# Patient Record
Sex: Female | Born: 1975 | Race: White | Hispanic: No | Marital: Married | Smoking: Former smoker
Health system: Southern US, Community
[De-identification: ages and names within clinical notes are randomized; demographics above are authoritative.]

## PROBLEM LIST (undated history)

## (undated) DIAGNOSIS — F329 Major depressive disorder, single episode, unspecified: Secondary | ICD-10-CM

## (undated) DIAGNOSIS — R519 Headache, unspecified: Secondary | ICD-10-CM

## (undated) DIAGNOSIS — F32A Depression, unspecified: Secondary | ICD-10-CM

## (undated) DIAGNOSIS — M797 Fibromyalgia: Secondary | ICD-10-CM

## (undated) DIAGNOSIS — D759 Disease of blood and blood-forming organs, unspecified: Secondary | ICD-10-CM

## (undated) DIAGNOSIS — R51 Headache: Secondary | ICD-10-CM

## (undated) DIAGNOSIS — I10 Essential (primary) hypertension: Secondary | ICD-10-CM

## (undated) DIAGNOSIS — E119 Type 2 diabetes mellitus without complications: Secondary | ICD-10-CM

## (undated) HISTORY — PX: APPENDECTOMY: SHX54

---

## 1997-01-20 HISTORY — PX: TUBAL LIGATION: SHX77

## 1998-01-20 HISTORY — PX: BONY PELVIS SURGERY: SHX572

## 2000-01-21 HISTORY — PX: ABDOMINAL HYSTERECTOMY: SHX81

## 2008-01-21 HISTORY — PX: CHOLECYSTECTOMY: SHX55

## 2012-10-21 DIAGNOSIS — L03211 Cellulitis of face: Secondary | ICD-10-CM | POA: Insufficient documentation

## 2013-01-31 DIAGNOSIS — N83202 Unspecified ovarian cyst, left side: Secondary | ICD-10-CM | POA: Insufficient documentation

## 2013-01-31 DIAGNOSIS — R102 Pelvic and perineal pain: Secondary | ICD-10-CM | POA: Insufficient documentation

## 2013-01-31 DIAGNOSIS — N809 Endometriosis, unspecified: Secondary | ICD-10-CM | POA: Insufficient documentation

## 2013-11-08 DIAGNOSIS — G43709 Chronic migraine without aura, not intractable, without status migrainosus: Secondary | ICD-10-CM | POA: Insufficient documentation

## 2014-03-23 DIAGNOSIS — E118 Type 2 diabetes mellitus with unspecified complications: Secondary | ICD-10-CM | POA: Insufficient documentation

## 2014-06-21 DIAGNOSIS — M791 Myalgia, unspecified site: Secondary | ICD-10-CM | POA: Insufficient documentation

## 2015-01-03 DIAGNOSIS — F329 Major depressive disorder, single episode, unspecified: Secondary | ICD-10-CM | POA: Insufficient documentation

## 2015-06-08 DIAGNOSIS — E669 Obesity, unspecified: Secondary | ICD-10-CM | POA: Insufficient documentation

## 2015-10-09 DIAGNOSIS — M797 Fibromyalgia: Secondary | ICD-10-CM | POA: Insufficient documentation

## 2015-10-09 DIAGNOSIS — M545 Low back pain, unspecified: Secondary | ICD-10-CM | POA: Insufficient documentation

## 2015-10-09 DIAGNOSIS — E118 Type 2 diabetes mellitus with unspecified complications: Secondary | ICD-10-CM | POA: Insufficient documentation

## 2015-10-18 DIAGNOSIS — E782 Mixed hyperlipidemia: Secondary | ICD-10-CM | POA: Insufficient documentation

## 2015-12-24 DIAGNOSIS — F17210 Nicotine dependence, cigarettes, uncomplicated: Secondary | ICD-10-CM | POA: Insufficient documentation

## 2016-01-22 DIAGNOSIS — M5116 Intervertebral disc disorders with radiculopathy, lumbar region: Secondary | ICD-10-CM | POA: Insufficient documentation

## 2016-01-22 DIAGNOSIS — F419 Anxiety disorder, unspecified: Secondary | ICD-10-CM | POA: Insufficient documentation

## 2016-01-22 DIAGNOSIS — G4700 Insomnia, unspecified: Secondary | ICD-10-CM | POA: Insufficient documentation

## 2016-05-05 ENCOUNTER — Other Ambulatory Visit: Payer: Self-pay | Admitting: Orthopedic Surgery

## 2016-05-05 DIAGNOSIS — M545 Low back pain, unspecified: Secondary | ICD-10-CM

## 2016-05-07 ENCOUNTER — Ambulatory Visit
Admission: RE | Admit: 2016-05-07 | Discharge: 2016-05-07 | Disposition: A | Discharge status: Home / Self Care | Payer: 59 | Source: Ambulatory Visit | Attending: Orthopedic Surgery | Admitting: Orthopedic Surgery

## 2016-05-07 ENCOUNTER — Encounter: Payer: Self-pay | Admitting: Radiology

## 2016-05-07 ENCOUNTER — Other Ambulatory Visit: Payer: Self-pay | Admitting: Orthopedic Surgery

## 2016-05-07 VITALS — BP 159/71 | HR 98 | Resp 20 | Ht 67.0 in | Wt 177.0 lb

## 2016-05-07 DIAGNOSIS — M545 Low back pain, unspecified: Secondary | ICD-10-CM

## 2016-05-07 DIAGNOSIS — M5116 Intervertebral disc disorders with radiculopathy, lumbar region: Secondary | ICD-10-CM

## 2016-05-07 MED ORDER — MIDAZOLAM HCL 2 MG/2ML IJ SOLN
1.0000 mg | INTRAMUSCULAR | Status: DC | PRN
  Administered 2016-05-07: 0.5 mg via INTRAVENOUS
  Administered 2016-05-07 (×2): 1 mg via INTRAVENOUS
  Administered 2016-05-07: 0.5 mg via INTRAVENOUS

## 2016-05-07 MED ORDER — VANCOMYCIN HCL IN DEXTROSE 1-5 GM/200ML-% IV SOLN
1000.0000 mg | Freq: Once | INTRAVENOUS | Status: AC
  Administered 2016-05-07: 1000 mg via INTRAVENOUS

## 2016-05-07 MED ORDER — SODIUM CHLORIDE 0.9 % IV SOLN
Freq: Once | INTRAVENOUS | Status: AC
  Administered 2016-05-07: 08:00:00 via INTRAVENOUS

## 2016-05-07 MED ORDER — HYDROMORPHONE HCL 2 MG/ML IJ SOLN
2.0000 mg | Freq: Once | INTRAMUSCULAR | Status: AC
  Administered 2016-05-07: 2 mg via INTRAMUSCULAR

## 2016-05-07 MED ORDER — CEFAZOLIN IN D5W 1 GM/50ML IV SOLN: 1.0000 g | Freq: Once | INTRAVENOUS | Status: DC

## 2016-05-07 MED ORDER — FENTANYL CITRATE (PF) 100 MCG/2ML IJ SOLN
25.0000 ug | INTRAMUSCULAR | Status: DC | PRN
  Administered 2016-05-07 (×2): 50 ug via INTRAVENOUS

## 2016-05-07 MED ORDER — ONDANSETRON HCL 4 MG/2ML IJ SOLN
4.0000 mg | Freq: Once | INTRAMUSCULAR | Status: AC
  Administered 2016-05-07: 4 mg via INTRAVENOUS

## 2016-05-07 MED ORDER — ONDANSETRON HCL 4 MG/2ML IJ SOLN: 4.0000 mg | Freq: Once | INTRAMUSCULAR | Status: DC

## 2016-05-07 MED ORDER — KETOROLAC TROMETHAMINE 30 MG/ML IJ SOLN
30.0000 mg | Freq: Once | INTRAMUSCULAR | Status: AC
  Administered 2016-05-07: 30 mg via INTRAVENOUS

## 2016-05-07 MED ORDER — IOPAMIDOL (ISOVUE-M 200) INJECTION 41%
8.0000 mL | Freq: Once | INTRAMUSCULAR | Status: AC
  Administered 2016-05-07: 8 mL

## 2016-05-07 NOTE — Discharge Instructions (Signed)
Discogram Post Procedure Discharge Instructions ° °1. May resume a regular diet and any medications that you routinely take (including pain medications). °2. No driving day of procedure. °3. Upon discharge go home and rest for at least 4 hours.  May use an ice pack as needed to injection sites on back.  Ice to back 30 minutes on and 30 minutes off, all day. °4. May remove bandades later, today. °5. It is not unusual to be sore for several days after this procedure. ° ° ° °Please contact our office at [phone] for the following symptoms: ° °· Fever greater than 100 degrees °· Increased swelling, pain, or redness at injection site. ° ° °Thank you for visiting [facility]. ° ° °

## 2016-05-07 NOTE — Progress Notes (Signed)
28 minutes sedation time for discogram.  Donell Sievert, RN

## 2016-05-20 ENCOUNTER — Ambulatory Visit: Payer: Self-pay | Admitting: Physician Assistant

## 2016-06-02 NOTE — Pre-Procedure Instructions (Signed)
Debra Padilla  06/02/2016      [facility] [place] [address] [place] [address] Phone: [phone] Fax: [phone]    Your procedure is scheduled on May 24  [facility] at 530 A.M.  Call this number if you have problems the morning of surgery:  [phone]   Remember:  Do not eat food or drink liquids after midnight.  Take these medicines the morning of surgery with A SIP OF WATER Dimenhydrinate (Dramamine), hydrocodone (Norco) if needed, Loratadine (Claritin), Sertraline (Zoloft)  Stop/ take aspirin as directed by your Dr.   Stop taking Ibuprofen, Advil, Motrin, BC's, goody's, Herbal medications, Vitamins, Fish Oil    How to Manage Your Diabetes Before and After Surgery  Why is it important to control my blood sugar before and after surgery? . Improving blood sugar levels before and after surgery helps healing and can limit problems. . A way of improving blood sugar control is eating a healthy diet by: o  Eating less sugar and carbohydrates o  Increasing activity/exercise o  Talking with your doctor about reaching your blood sugar goals . High blood sugars (greater than 180 mg/dL) can raise your risk of infections and slow your recovery, so you will need to focus on controlling your diabetes during the weeks before surgery. . Make sure that the doctor who takes care of your diabetes knows about your planned surgery including the date and location.  How do I manage my blood sugar before surgery? . Check your blood sugar at least 4 times a day, starting 2 days before surgery, to make sure that the level is not too high or low. o Check your blood sugar the morning of your surgery when you wake up and every 2 hours until you get to the Short Stay unit. . If your blood sugar is less than 70 mg/dL, you will need to treat for low blood sugar: o Do not take insulin. o Treat  a low blood sugar (less than 70 mg/dL) with  cup of clear juice (cranberry or apple), 4 glucose tablets, OR glucose gel. o Recheck blood sugar in 15 minutes after treatment (to make sure it is greater than 70 mg/dL). If your blood sugar is not greater than 70 mg/dL on recheck, call [phone] for further instructions. . Report your blood sugar to the short stay nurse when you get to Short Stay.  . If you are admitted to the hospital after surgery: o Your blood sugar will be checked by the staff and you will probably be given insulin after surgery (instead of oral diabetes medicines) to make sure you have good blood sugar levels. o The goal for blood sugar control after surgery is 80-180 mg/dL.              WHAT DO I DO ABOUT MY DIABETES MEDICATION?   Do not take oral diabetes medicines (pills) the morning of surgery. Metformin (Glucophage)       . The day of surgery, do not take other diabetes injectables, including Byetta (exenatide), Bydureon (exenatide ER), Victoza (liraglutide), or Trulicity (dulaglutide).  . If your CBG is greater than 220 mg/dL, you may take  of your sliding scale (correction) dose of insulin.  Other Instructions:          Patient Signature:  Date:   Nurse Signature:  Date:   Reviewed and Endorsed by [facility] Patient Education Committee, August  2015  Do not wear jewelry, make-up or nail polish.  Do not wear lotions, powders, or perfumes, or deoderant.  Do not shave 48 hours prior to surgery.  Men may shave face and neck.  Do not bring valuables to the hospital.  [facility] is not responsible for any belongings or valuables.  Contacts, dentures or bridgework may not be worn into surgery.  Leave your suitcase in the car.  After surgery it may be brought to your room.  For patients admitted to the hospital, discharge time will be determined by your treatment team.  Patients discharged the day of surgery will not be allowed to drive home.     Special instructions:  [place] - Preparing for Surgery  Before surgery, you can play an important role.  Because skin is not sterile, your skin needs to be as free of germs as possible.  You can reduce the number of germs on you skin by washing with CHG (chlorahexidine gluconate) soap before surgery.  CHG is an antiseptic cleaner which kills germs and bonds with the skin to continue killing germs even after washing.  Please DO NOT use if you have an allergy to CHG or antibacterial soaps.  If your skin becomes reddened/irritated stop using the CHG and inform your nurse when you arrive at Short Stay.  Do not shave (including legs and underarms) for at least 48 hours prior to the first CHG shower.  You may shave your face.  Please follow these instructions carefully:   1.  Shower with CHG Soap the night before surgery and the  morning of Surgery.  2.  If you choose to wash your hair, wash your hair first as usual with your normal shampoo.  3.  After you shampoo, rinse your hair and body thoroughly to remove the  Shampoo.  4.  Use CHG as you would any other liquid soap.  You can apply chg directly  to the skin and wash gently with scrungie or a clean washcloth.  5.  Apply the CHG Soap to your body ONLY FROM THE NECK DOWN.   Do not use on open wounds or open sores.  Avoid contact with your eyes,       ears, mouth and genitals (private parts).  Wash genitals (private parts)  with your normal soap.  6.  Wash thoroughly, paying special attention to the area where your surgery will be performed.  7.  Thoroughly rinse your body with warm water from the neck down.  8.  DO NOT shower/wash with your normal soap after using and rinsing off  the CHG Soap.  9.  Pat yourself dry with a clean towel.            10.  Wear clean pajamas.            11.  Place clean sheets on your bed the night of your first shower and do not   sleep with pets.  Day of Surgery  Do not apply any lotions/deoderants the  morning of surgery.  Please wear clean clothes to the hospital/surgery center.     Please read over the following fact sheets that you were given. Pain Booklet, Coughing and Deep Breathing, MRSA Information and Surgical Site Infection Prevention

## 2016-06-03 ENCOUNTER — Encounter (HOSPITAL_COMMUNITY): Payer: Self-pay

## 2016-06-03 ENCOUNTER — Encounter (HOSPITAL_COMMUNITY)
Admission: RE | Admit: 2016-06-03 | Discharge: 2016-06-03 | Disposition: A | Discharge status: Home / Self Care | Payer: 59 | Source: Ambulatory Visit | Attending: Orthopedic Surgery | Admitting: Orthopedic Surgery

## 2016-06-03 DIAGNOSIS — E119 Type 2 diabetes mellitus without complications: Secondary | ICD-10-CM | POA: Insufficient documentation

## 2016-06-03 DIAGNOSIS — Z01812 Encounter for preprocedural laboratory examination: Secondary | ICD-10-CM | POA: Insufficient documentation

## 2016-06-03 DIAGNOSIS — M5136 Other intervertebral disc degeneration, lumbar region: Secondary | ICD-10-CM | POA: Insufficient documentation

## 2016-06-03 DIAGNOSIS — D759 Disease of blood and blood-forming organs, unspecified: Secondary | ICD-10-CM | POA: Insufficient documentation

## 2016-06-03 DIAGNOSIS — M5137 Other intervertebral disc degeneration, lumbosacral region: Secondary | ICD-10-CM | POA: Insufficient documentation

## 2016-06-03 DIAGNOSIS — F329 Major depressive disorder, single episode, unspecified: Secondary | ICD-10-CM | POA: Diagnosis not present

## 2016-06-03 DIAGNOSIS — R51 Headache: Secondary | ICD-10-CM | POA: Insufficient documentation

## 2016-06-03 DIAGNOSIS — M797 Fibromyalgia: Secondary | ICD-10-CM | POA: Diagnosis not present

## 2016-06-03 DIAGNOSIS — I10 Essential (primary) hypertension: Secondary | ICD-10-CM | POA: Diagnosis not present

## 2016-06-03 HISTORY — DX: Disease of blood and blood-forming organs, unspecified: D75.9

## 2016-06-03 HISTORY — DX: Fibromyalgia: M79.7

## 2016-06-03 HISTORY — DX: Headache, unspecified: R51.9

## 2016-06-03 HISTORY — DX: Type 2 diabetes mellitus without complications: E11.9

## 2016-06-03 HISTORY — DX: Major depressive disorder, single episode, unspecified: F32.9

## 2016-06-03 HISTORY — DX: Essential (primary) hypertension: I10

## 2016-06-03 HISTORY — DX: Headache: R51

## 2016-06-03 HISTORY — DX: Depression, unspecified: F32.A

## 2016-06-03 LAB — SURGICAL PCR SCREEN
MRSA, PCR: NEGATIVE
STAPHYLOCOCCUS AUREUS: NEGATIVE

## 2016-06-03 LAB — BASIC METABOLIC PANEL
ANION GAP: 10 (ref 5–15)
BUN: 5 mg/dL — ABNORMAL LOW (ref 6–20)
CALCIUM: 9.2 mg/dL (ref 8.9–10.3)
CO2: 23 mmol/L (ref 22–32)
Chloride: 104 mmol/L (ref 101–111)
Creatinine, Ser: 0.58 mg/dL (ref 0.44–1.00)
GLUCOSE: 90 mg/dL (ref 65–99)
POTASSIUM: 3.7 mmol/L (ref 3.5–5.1)
Sodium: 137 mmol/L (ref 135–145)

## 2016-06-03 LAB — CBC
HEMATOCRIT: 44.1 % (ref 36.0–46.0)
Hemoglobin: 15 g/dL (ref 12.0–15.0)
MCH: 32.8 pg (ref 26.0–34.0)
MCHC: 34 g/dL (ref 30.0–36.0)
MCV: 96.5 fL (ref 78.0–100.0)
PLATELETS: 219 10*3/uL (ref 150–400)
RBC: 4.57 MIL/uL (ref 3.87–5.11)
RDW: 12.8 % (ref 11.5–15.5)
WBC: 12 10*3/uL — ABNORMAL HIGH (ref 4.0–10.5)

## 2016-06-03 LAB — TYPE AND SCREEN
ABO/RH(D): O POS
Antibody Screen: NEGATIVE

## 2016-06-03 LAB — ABO/RH: ABO/RH(D): O POS

## 2016-06-03 LAB — GLUCOSE, CAPILLARY: Glucose-Capillary: 113 mg/dL — ABNORMAL HIGH (ref 65–99)

## 2016-06-03 NOTE — Pre-Procedure Instructions (Addendum)
Dian Carte  06/03/2016      [facility] [place] [address] [place] [address] Phone: [phone] Fax: [phone]    Your procedure is scheduled on May 24  Report to [facility] Admitting at 530 A.M.  Call this number if you have problems the morning of surgery:  [phone]   Remember:  Do not eat food or drink liquids after midnight.  Take these medicines the morning of surgery with A SIP OF WATER ), hydrocodone (Norco) if needed, Sertraline (Zoloft)  Stop taking Ibuprofen, Advil, Motrin, BC's, goody's, Herbal medications, Vitamins, Fish Oil,aspirin,probiotic  No metformin day of surgery    How to Manage Your Diabetes Before and After Surgery  Why is it important to control my blood sugar before and after surgery? . Improving blood sugar levels before and after surgery helps healing and can limit problems. . A way of improving blood sugar control is eating a healthy diet by: o  Eating less sugar and carbohydrates o  Increasing activity/exercise o  Talking with your doctor about reaching your blood sugar goals . High blood sugars (greater than 180 mg/dL) can raise your risk of infections and slow your recovery, so you will need to focus on controlling your diabetes during the weeks before surgery. . Make sure that the doctor who takes care of your diabetes knows about your planned surgery including the date and location.  How do I manage my blood sugar before surgery? . Check your blood sugar at least 4 times a day, starting 2 days before surgery, to make sure that the level is not too high or low. o Check your blood sugar the morning of your surgery when you wake up and every 2 hours until you get to the Short Stay unit. . If your blood sugar is less than 70 mg/dL, you will need to treat for low blood sugar: o Do not take insulin. o Treat a low blood sugar (less than 70 mg/dL) with   cup of clear juice (cranberry or apple), 4 glucose tablets, OR glucose gel. o Recheck blood sugar in 15 minutes after treatment (to make sure it is greater than 70 mg/dL). If your blood sugar is not greater than 70 mg/dL on recheck, call [phone] for further instructions. . Report your blood sugar to the short stay nurse when you get to Short Stay.  . If you are admitted to the hospital after surgery: o Your blood sugar will be checked by the staff and you will probably be given insulin after surgery (instead of oral diabetes medicines) to make sure you have good blood sugar levels. o The goal for blood sugar control after surgery is 80-180 mg/dL.  WHAT DO I DO ABOUT MY DIABETES MEDICATION?   Do not take oral diabetes medicines (pills) the morning of surgery. Metformin (Glucophage)        Do not wear jewelry, make-up or nail polish.  Do not wear lotions, powders, or perfumes, or deoderant.  Do not shave 48 hours prior to surgery.  Men may shave face and neck.  Do not bring valuables to the hospital.  [facility] is not responsible for any belongings or valuables.  Contacts, dentures or bridgework may not be worn into surgery.  Leave your suitcase in the car.  After surgery it may be brought to your room.  For patients admitted to the hospital, discharge time will be determined by your treatment  team.  Patients discharged the day of surgery will not be allowed to drive home.   Special instructions:  [place] - Preparing for Surgery  Before surgery, you can play an important role.  Because skin is not sterile, your skin needs to be as free of germs as possible.  You can reduce the number of germs on you skin by washing with CHG (chlorahexidine gluconate) soap before surgery.  CHG is an antiseptic cleaner which kills germs and bonds with the skin to continue killing germs even after washing.  Please DO NOT use if you have an allergy to CHG or antibacterial soaps.  If your skin  becomes reddened/irritated stop using the CHG and inform your nurse when you arrive at Short Stay.  Do not shave (including legs and underarms) for at least 48 hours prior to the first CHG shower.  You may shave your face.  Please follow these instructions carefully:   1.  Shower with CHG Soap the night before surgery and the  morning of Surgery.  2.  If you choose to wash your hair, wash your hair first as usual with your normal shampoo.  3.  After you shampoo, rinse your hair and body thoroughly to remove the  Shampoo.  4.  Use CHG as you would any other liquid soap.  You can apply chg directly  to the skin and wash gently with scrungie or a clean washcloth.  5.  Apply the CHG Soap to your body ONLY FROM THE NECK DOWN.   Do not use on open wounds or open sores.  Avoid contact with your eyes,       ears, mouth and genitals (private parts).  Wash genitals (private parts)  with your normal soap.  6.  Wash thoroughly, paying special attention to the area where your surgery will be performed.  7.  Thoroughly rinse your body with warm water from the neck down.  8.  DO NOT shower/wash with your normal soap after using and rinsing off  the CHG Soap.  9.  Pat yourself dry with a clean towel.            10.  Wear clean pajamas.            11.  Place clean sheets on your bed the night of your first shower and do not   sleep with pets.  Day of Surgery  Do not apply any lotions/deoderants the morning of surgery.  Please wear clean clothes to the hospital/surgery center.     Please read over the  fact sheets that you were given.

## 2016-06-12 ENCOUNTER — Encounter (HOSPITAL_COMMUNITY): Admission: RE | Payer: Self-pay | Source: Ambulatory Visit

## 2016-06-12 ENCOUNTER — Inpatient Hospital Stay (HOSPITAL_COMMUNITY): Admission: RE | Admit: 2016-06-12 | Payer: 59 | Source: Ambulatory Visit | Admitting: Orthopedic Surgery

## 2016-06-12 SURGERY — TRANSFORAMINAL LUMBAR INTERBODY FUSION (TLIF) WITH PEDICLE SCREW FIXATION 2 LEVEL
Anesthesia: General

## 2016-07-21 ENCOUNTER — Ambulatory Visit: Payer: Self-pay | Admitting: Physician Assistant

## 2016-08-04 ENCOUNTER — Encounter (HOSPITAL_COMMUNITY)
Admission: RE | Admit: 2016-08-04 | Discharge: 2016-08-04 | Disposition: A | Discharge status: Home / Self Care | Payer: 59 | Source: Ambulatory Visit | Attending: Orthopedic Surgery | Admitting: Orthopedic Surgery

## 2016-08-04 ENCOUNTER — Encounter (HOSPITAL_COMMUNITY): Payer: Self-pay

## 2016-08-04 LAB — CBC
HCT: 43.3 % (ref 36.0–46.0)
Hemoglobin: 14.8 g/dL (ref 12.0–15.0)
MCH: 33.7 pg (ref 26.0–34.0)
MCHC: 34.2 g/dL (ref 30.0–36.0)
MCV: 98.6 fL (ref 78.0–100.0)
PLATELETS: 231 10*3/uL (ref 150–400)
RBC: 4.39 MIL/uL (ref 3.87–5.11)
RDW: 12.9 % (ref 11.5–15.5)
WBC: 12.6 10*3/uL — ABNORMAL HIGH (ref 4.0–10.5)

## 2016-08-04 LAB — SURGICAL PCR SCREEN
MRSA, PCR: NEGATIVE
Staphylococcus aureus: NEGATIVE

## 2016-08-04 LAB — BASIC METABOLIC PANEL
Anion gap: 9 (ref 5–15)
BUN: 6 mg/dL (ref 6–20)
CO2: 26 mmol/L (ref 22–32)
CREATININE: 0.8 mg/dL (ref 0.44–1.00)
Calcium: 9.5 mg/dL (ref 8.9–10.3)
Chloride: 101 mmol/L (ref 101–111)
GFR calc Af Amer: >60 mL/min (ref >60)
GLUCOSE: 189 mg/dL — AB (ref 65–99)
POTASSIUM: 4.3 mmol/L (ref 3.5–5.1)
SODIUM: 136 mmol/L (ref 135–145)

## 2016-08-04 LAB — TYPE AND SCREEN
ABO/RH(D): O POS
Antibody Screen: NEGATIVE

## 2016-08-04 LAB — GLUCOSE, CAPILLARY: GLUCOSE-CAPILLARY: 223 mg/dL — AB (ref 65–99)

## 2016-08-04 NOTE — Pre-Procedure Instructions (Signed)
    Tawnia Fera  08/04/2016      [facility] [place] [address] [place] [address] Phone: [phone] Fax: [phone]    Your procedure is scheduled on Thursday, July 19th   [facility] at 5:30 AM             (posted surgery time 7:30am-11:27am) .  Call this number if you have problems the morning of surgery:  [phone], other questions call [phone] Mon-Fri from 8a-4p   Remember:  Do not eat food or drink liquids after midnight Wednesday.                        4-5 days prior to surgery, STOP TAKING any Vitamins, Herbal Supplements, Anti-inflammatories.   Take these medicines the morning of surgery with A SIP OF WATER : Hydrocodone.              DO NOT TAKE ANY DIABETES MEDICATION THE MORNING OF SURGERY.   Do not wear jewelry, make-up or nail polish.  Do not wear lotions, powders, perfumes, or deoderant.  Do not shave 48 hours prior to surgery.    Do not bring valuables to the hospital.  [facility] is not responsible for any belongings or valuables.  Contacts, dentures or bridgework may not be worn into surgery.  Leave your suitcase in the car.  After surgery it may be brought to your room.  For patients admitted to the hospital, discharge time will be determined by your treatment team.  Please read over the following fact sheets that you were given. Pain Booklet, MRSA Information and Surgical Site Infection Prevention

## 2016-08-04 NOTE — Progress Notes (Signed)
PCP is Dr. Vear ClockPhillips  @ [facility]   A1C 7.0  05/2016.   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  Hematologist is Dr[facility] Mariel [facility]Hareesh [facility] .  Has not had a phlebotomy in over a yr.  Iron levels are "normal" Am blood sugars run 130 - 200. Denies any cardiac issues, not been to see a cardio.

## 2016-08-06 MED ORDER — VANCOMYCIN HCL 10 G IV SOLR
1250.0000 mg | INTRAVENOUS | Status: AC
  Administered 2016-08-07: 1250 mg via INTRAVENOUS
  Filled 2016-08-06: qty 1250

## 2016-08-06 NOTE — Anesthesia Preprocedure Evaluation (Addendum)
Anesthesia Evaluation  Patient identified by MRN, date of birth, ID band Patient awake    Reviewed: Allergy & Precautions, H&P , Patient's Chart, lab work & pertinent test results, reviewed documented beta blocker date and time   Airway Mallampati: II  TM Distance: >3 FB Neck ROM: full    Dental no notable dental hx. (+) Partial Upper, Missing, Dental Advidsory Given   Pulmonary former smoker,    Pulmonary exam normal breath sounds clear to auscultation       Cardiovascular hypertension,  Rhythm:regular Rate:Normal     Neuro/Psych    GI/Hepatic   Endo/Other  diabetes, Type 2  Renal/GU      Musculoskeletal   Abdominal   Peds  Hematology  (+) Blood dyscrasia, ,   Anesthesia Other Findings hemochromocytosis  Reproductive/Obstetrics                            Anesthesia Physical Anesthesia Plan  ASA: II  Anesthesia Plan: General   Post-op Pain Management:    Induction: Intravenous  PONV Risk Score and Plan: 2 and 3 and Ondansetron, Dexamethasone and Propofol  Airway Management Planned: Oral ETT  Additional Equipment:   Intra-op Plan:   Post-operative Plan: Extubation in OR  Informed Consent: I have reviewed the patients History and Physical, chart, labs and discussed the procedure including the risks, benefits and alternatives for the proposed anesthesia with the patient or authorized representative who has indicated his/her understanding and acceptance.   Dental Advisory Given  Plan Discussed with: CRNA, Surgeon and Anesthesiologist  Anesthesia Plan Comments: (  )      Anesthesia Quick Evaluation

## 2016-08-07 ENCOUNTER — Encounter (HOSPITAL_COMMUNITY): Payer: Self-pay

## 2016-08-07 ENCOUNTER — Inpatient Hospital Stay (HOSPITAL_COMMUNITY)
Admission: RE | Admit: 2016-08-07 | Discharge: 2016-08-08 | DRG: 460 | Disposition: A | Discharge status: Home / Self Care | Payer: 59 | Source: Ambulatory Visit | Attending: Orthopedic Surgery | Admitting: Orthopedic Surgery

## 2016-08-07 ENCOUNTER — Inpatient Hospital Stay (HOSPITAL_COMMUNITY): Payer: 59 | Admitting: Anesthesiology

## 2016-08-07 ENCOUNTER — Encounter (HOSPITAL_COMMUNITY)
Admission: RE | Disposition: A | Discharge status: Home / Self Care | Payer: Self-pay | Source: Ambulatory Visit | Attending: Orthopedic Surgery

## 2016-08-07 ENCOUNTER — Inpatient Hospital Stay (HOSPITAL_COMMUNITY): Payer: 59

## 2016-08-07 DIAGNOSIS — I10 Essential (primary) hypertension: Secondary | ICD-10-CM | POA: Diagnosis present

## 2016-08-07 DIAGNOSIS — Z885 Allergy status to narcotic agent status: Secondary | ICD-10-CM

## 2016-08-07 DIAGNOSIS — Z981 Arthrodesis status: Secondary | ICD-10-CM

## 2016-08-07 DIAGNOSIS — E119 Type 2 diabetes mellitus without complications: Secondary | ICD-10-CM | POA: Diagnosis present

## 2016-08-07 DIAGNOSIS — Z881 Allergy status to other antibiotic agents status: Secondary | ICD-10-CM

## 2016-08-07 DIAGNOSIS — M5117 Intervertebral disc disorders with radiculopathy, lumbosacral region: Principal | ICD-10-CM | POA: Diagnosis present

## 2016-08-07 DIAGNOSIS — Z79891 Long term (current) use of opiate analgesic: Secondary | ICD-10-CM | POA: Diagnosis not present

## 2016-08-07 DIAGNOSIS — Z833 Family history of diabetes mellitus: Secondary | ICD-10-CM | POA: Diagnosis not present

## 2016-08-07 DIAGNOSIS — Z7984 Long term (current) use of oral hypoglycemic drugs: Secondary | ICD-10-CM | POA: Diagnosis not present

## 2016-08-07 DIAGNOSIS — Z87891 Personal history of nicotine dependence: Secondary | ICD-10-CM

## 2016-08-07 DIAGNOSIS — M4320 Fusion of spine, site unspecified: Secondary | ICD-10-CM

## 2016-08-07 DIAGNOSIS — Z880 Allergy status to penicillin: Secondary | ICD-10-CM

## 2016-08-07 DIAGNOSIS — Z7982 Long term (current) use of aspirin: Secondary | ICD-10-CM | POA: Diagnosis not present

## 2016-08-07 DIAGNOSIS — M5442 Lumbago with sciatica, left side: Secondary | ICD-10-CM | POA: Diagnosis present

## 2016-08-07 DIAGNOSIS — Z419 Encounter for procedure for purposes other than remedying health state, unspecified: Secondary | ICD-10-CM

## 2016-08-07 DIAGNOSIS — Z8249 Family history of ischemic heart disease and other diseases of the circulatory system: Secondary | ICD-10-CM

## 2016-08-07 DIAGNOSIS — M549 Dorsalgia, unspecified: Secondary | ICD-10-CM | POA: Diagnosis present

## 2016-08-07 HISTORY — PX: TRANSFORAMINAL LUMBAR INTERBODY FUSION (TLIF) WITH PEDICLE SCREW FIXATION 1 LEVEL: SHX6141

## 2016-08-07 LAB — GLUCOSE, CAPILLARY
GLUCOSE-CAPILLARY: 122 mg/dL — AB (ref 65–99)
GLUCOSE-CAPILLARY: 169 mg/dL — AB (ref 65–99)
GLUCOSE-CAPILLARY: 196 mg/dL — AB (ref 65–99)
GLUCOSE-CAPILLARY: 222 mg/dL — AB (ref 65–99)
Glucose-Capillary: 266 mg/dL — ABNORMAL HIGH (ref 65–99)

## 2016-08-07 SURGERY — TRANSFORAMINAL LUMBAR INTERBODY FUSION (TLIF) WITH PEDICLE SCREW FIXATION 1 LEVEL
Anesthesia: General | Site: Back

## 2016-08-07 MED ORDER — INSULIN ASPART 100 UNIT/ML ~~LOC~~ SOLN
0.0000 [IU] | Freq: Three times a day (TID) | SUBCUTANEOUS | Status: DC
  Administered 2016-08-08: 2 [IU] via SUBCUTANEOUS

## 2016-08-07 MED ORDER — FENTANYL CITRATE (PF) 100 MCG/2ML IJ SOLN
25.0000 ug | INTRAMUSCULAR | Status: DC | PRN
  Administered 2016-08-07 (×3): 50 ug via INTRAVENOUS

## 2016-08-07 MED ORDER — MENTHOL 3 MG MT LOZG: 1.0000 | LOZENGE | OROMUCOSAL | Status: DC | PRN

## 2016-08-07 MED ORDER — ONDANSETRON HCL 4 MG/2ML IJ SOLN
INTRAMUSCULAR | Status: DC | PRN
  Administered 2016-08-07: 4 mg via INTRAVENOUS

## 2016-08-07 MED ORDER — SUGAMMADEX SODIUM 200 MG/2ML IV SOLN
INTRAVENOUS | Status: DC | PRN
  Administered 2016-08-07: 200 mg via INTRAVENOUS

## 2016-08-07 MED ORDER — ACETAMINOPHEN 650 MG RE SUPP: 650.0000 mg | RECTAL | Status: DC | PRN

## 2016-08-07 MED ORDER — MIDAZOLAM HCL 2 MG/2ML IJ SOLN
INTRAMUSCULAR | Status: AC
  Filled 2016-08-07: qty 2

## 2016-08-07 MED ORDER — FENTANYL CITRATE (PF) 250 MCG/5ML IJ SOLN
INTRAMUSCULAR | Status: AC
  Filled 2016-08-07: qty 5

## 2016-08-07 MED ORDER — MAGNESIUM CITRATE PO SOLN
0.5000 | Freq: Once | ORAL | Status: AC | PRN
  Administered 2016-08-07: 0.5 via ORAL
  Filled 2016-08-07: qty 296

## 2016-08-07 MED ORDER — LIDOCAINE HCL (CARDIAC) 20 MG/ML IV SOLN
INTRAVENOUS | Status: DC | PRN
  Administered 2016-08-07: 100 mg via INTRAVENOUS

## 2016-08-07 MED ORDER — ROCURONIUM BROMIDE 50 MG/5ML IV SOLN
INTRAVENOUS | Status: AC
  Filled 2016-08-07: qty 1

## 2016-08-07 MED ORDER — HYDROMORPHONE HCL 1 MG/ML IJ SOLN
0.5000 mg | INTRAMUSCULAR | Status: DC | PRN
  Administered 2016-08-07 (×2): 0.5 mg via INTRAVENOUS
  Filled 2016-08-07 (×2): qty 0.5

## 2016-08-07 MED ORDER — ACETAMINOPHEN 325 MG PO TABS: 650.0000 mg | ORAL_TABLET | ORAL | Status: DC | PRN

## 2016-08-07 MED ORDER — ROCURONIUM BROMIDE 100 MG/10ML IV SOLN
INTRAVENOUS | Status: DC | PRN
  Administered 2016-08-07: 40 mg via INTRAVENOUS

## 2016-08-07 MED ORDER — VANCOMYCIN HCL IN DEXTROSE 1-5 GM/200ML-% IV SOLN
1000.0000 mg | Freq: Once | INTRAVENOUS | Status: AC
  Administered 2016-08-07: 1000 mg via INTRAVENOUS
  Filled 2016-08-07: qty 200

## 2016-08-07 MED ORDER — METHOCARBAMOL 500 MG PO TABS
500.0000 mg | ORAL_TABLET | Freq: Four times a day (QID) | ORAL | Status: DC | PRN
  Administered 2016-08-07: 500 mg via ORAL

## 2016-08-07 MED ORDER — PHENOL 1.4 % MT LIQD: 1.0000 | OROMUCOSAL | Status: DC | PRN

## 2016-08-07 MED ORDER — TIZANIDINE HCL 4 MG PO TABS: 4.0000 mg | ORAL_TABLET | Freq: Four times a day (QID) | ORAL | Status: DC | PRN

## 2016-08-07 MED ORDER — OXYCODONE HCL 5 MG PO TABS
ORAL_TABLET | ORAL | Status: AC
  Filled 2016-08-07: qty 2

## 2016-08-07 MED ORDER — SODIUM CHLORIDE 0.9% FLUSH
3.0000 mL | INTRAVENOUS | Status: DC | PRN
Start: 2016-08-07 — End: 2016-08-08

## 2016-08-07 MED ORDER — ONDANSETRON HCL 4 MG/2ML IJ SOLN
INTRAMUSCULAR | Status: AC
  Filled 2016-08-07: qty 2

## 2016-08-07 MED ORDER — DEXTROSE 5 % IV SOLN
INTRAVENOUS | Status: DC | PRN
  Administered 2016-08-07: 25 ug/min via INTRAVENOUS

## 2016-08-07 MED ORDER — PROPOFOL 500 MG/50ML IV EMUL
INTRAVENOUS | Status: DC | PRN
  Administered 2016-08-07: 50 ug/kg/min via INTRAVENOUS

## 2016-08-07 MED ORDER — THROMBIN 20000 UNITS EX SOLR
CUTANEOUS | Status: AC
  Filled 2016-08-07: qty 20000

## 2016-08-07 MED ORDER — LISINOPRIL 5 MG PO TABS
5.0000 mg | ORAL_TABLET | Freq: Every day | ORAL | Status: DC
  Administered 2016-08-07: 5 mg via ORAL
  Filled 2016-08-07 (×2): qty 1

## 2016-08-07 MED ORDER — ARTIFICIAL TEARS OPHTHALMIC OINT
TOPICAL_OINTMENT | OPHTHALMIC | Status: DC | PRN
  Administered 2016-08-07: 1 via OPHTHALMIC

## 2016-08-07 MED ORDER — BUPIVACAINE-EPINEPHRINE 0.25% -1:200000 IJ SOLN
INTRAMUSCULAR | Status: AC
  Filled 2016-08-07: qty 1

## 2016-08-07 MED ORDER — DEXAMETHASONE SODIUM PHOSPHATE 10 MG/ML IJ SOLN
INTRAMUSCULAR | Status: AC
  Filled 2016-08-07: qty 1

## 2016-08-07 MED ORDER — MIDAZOLAM HCL 5 MG/5ML IJ SOLN
INTRAMUSCULAR | Status: DC | PRN
  Administered 2016-08-07: 2 mg via INTRAVENOUS

## 2016-08-07 MED ORDER — LACTATED RINGERS IV SOLN
INTRAVENOUS | Status: DC | PRN
  Administered 2016-08-07 (×2): via INTRAVENOUS

## 2016-08-07 MED ORDER — METHOCARBAMOL 500 MG PO TABS
ORAL_TABLET | ORAL | Status: AC
  Filled 2016-08-07: qty 1

## 2016-08-07 MED ORDER — LACTATED RINGERS IV SOLN: INTRAVENOUS | Status: DC

## 2016-08-07 MED ORDER — LIDOCAINE HCL (CARDIAC) 20 MG/ML IV SOLN
INTRAVENOUS | Status: AC
  Filled 2016-08-07: qty 5

## 2016-08-07 MED ORDER — FENTANYL CITRATE (PF) 100 MCG/2ML IJ SOLN
INTRAMUSCULAR | Status: AC
Start: 2016-08-07 — End: 2016-08-07
  Filled 2016-08-07: qty 2

## 2016-08-07 MED ORDER — METHOCARBAMOL 1000 MG/10ML IJ SOLN
500.0000 mg | Freq: Four times a day (QID) | INTRAMUSCULAR | Status: DC | PRN
  Filled 2016-08-07: qty 5

## 2016-08-07 MED ORDER — METFORMIN HCL 500 MG PO TABS
1000.0000 mg | ORAL_TABLET | Freq: Two times a day (BID) | ORAL | Status: DC
  Administered 2016-08-07: 1000 mg via ORAL
  Filled 2016-08-07: qty 2

## 2016-08-07 MED ORDER — ONDANSETRON HCL 4 MG PO TABS
4.0000 mg | ORAL_TABLET | Freq: Four times a day (QID) | ORAL | Status: DC | PRN
  Administered 2016-08-07 – 2016-08-08 (×4): 4 mg via ORAL
  Filled 2016-08-07 (×4): qty 1

## 2016-08-07 MED ORDER — INSULIN ASPART 100 UNIT/ML ~~LOC~~ SOLN: 0.0000 [IU] | Freq: Every day | SUBCUTANEOUS | Status: DC

## 2016-08-07 MED ORDER — FENTANYL CITRATE (PF) 100 MCG/2ML IJ SOLN
INTRAMUSCULAR | Status: AC
  Filled 2016-08-07: qty 2

## 2016-08-07 MED ORDER — SODIUM CHLORIDE 0.9% FLUSH
3.0000 mL | Freq: Two times a day (BID) | INTRAVENOUS | Status: DC
  Administered 2016-08-07 (×2): 3 mL via INTRAVENOUS

## 2016-08-07 MED ORDER — 0.9 % SODIUM CHLORIDE (POUR BTL) OPTIME
TOPICAL | Status: DC | PRN
  Administered 2016-08-07: 1000 mL

## 2016-08-07 MED ORDER — PROPOFOL 10 MG/ML IV BOLUS
INTRAVENOUS | Status: DC | PRN
  Administered 2016-08-07: 50 mg via INTRAVENOUS

## 2016-08-07 MED ORDER — FENTANYL CITRATE (PF) 100 MCG/2ML IJ SOLN
INTRAMUSCULAR | Status: DC | PRN
  Administered 2016-08-07: 150 ug via INTRAVENOUS
  Administered 2016-08-07 (×5): 50 ug via INTRAVENOUS

## 2016-08-07 MED ORDER — POLYETHYLENE GLYCOL 3350 17 G PO PACK: 17.0000 g | PACK | Freq: Every day | ORAL | Status: DC | PRN

## 2016-08-07 MED ORDER — BUPIVACAINE-EPINEPHRINE 0.25% -1:200000 IJ SOLN
INTRAMUSCULAR | Status: DC | PRN
  Administered 2016-08-07: 50 mL

## 2016-08-07 MED ORDER — DEXAMETHASONE SODIUM PHOSPHATE 10 MG/ML IJ SOLN
INTRAMUSCULAR | Status: DC | PRN
  Administered 2016-08-07: 10 mg via INTRAVENOUS

## 2016-08-07 MED ORDER — OXYCODONE HCL 5 MG PO TABS
10.0000 mg | ORAL_TABLET | ORAL | Status: DC | PRN
  Administered 2016-08-07 – 2016-08-08 (×5): 10 mg via ORAL
  Filled 2016-08-07 (×4): qty 2

## 2016-08-07 MED ORDER — ACETAMINOPHEN 10 MG/ML IV SOLN
INTRAVENOUS | Status: AC
  Filled 2016-08-07: qty 100

## 2016-08-07 MED ORDER — ONDANSETRON HCL 4 MG/2ML IJ SOLN
4.0000 mg | Freq: Four times a day (QID) | INTRAMUSCULAR | Status: DC | PRN
  Administered 2016-08-07: 4 mg via INTRAVENOUS
  Filled 2016-08-07: qty 2

## 2016-08-07 SURGICAL SUPPLY — 69 items
BLADE CLIPPER SURG (BLADE) IMPLANT
BUR EGG ELITE 4.0 (BURR) IMPLANT
CANISTER SUCT 3000ML PPV (MISCELLANEOUS) ×2 IMPLANT
CLIP NEUROVISION LG (CLIP) ×2 IMPLANT
CLSR STERI-STRIP ANTIMIC 1/2X4 (GAUZE/BANDAGES/DRESSINGS) ×2 IMPLANT
COVER SURGICAL LIGHT HANDLE (MISCELLANEOUS) ×2 IMPLANT
DEVICE ENDSKLTN NANOLOCK 10 XL (Cage) ×1 IMPLANT
DRAPE C-ARM 42X72 X-RAY (DRAPES) ×2 IMPLANT
DRAPE C-ARMOR (DRAPES) ×2 IMPLANT
DRAPE POUCH INSTRU U-SHP 10X18 (DRAPES) ×2 IMPLANT
DRAPE SURG 17X23 STRL (DRAPES) ×2 IMPLANT
DRAPE U-SHAPE 47X51 STRL (DRAPES) ×2 IMPLANT
DRSG AQUACEL AG ADV 3.5X10 (GAUZE/BANDAGES/DRESSINGS) ×2 IMPLANT
DURAPREP 26ML APPLICATOR (WOUND CARE) ×2 IMPLANT
ELECT BLADE 4.0 EZ CLEAN MEGAD (MISCELLANEOUS) ×2
ELECT BLADE 6.5 EXT (BLADE) IMPLANT
ELECT PENCIL ROCKER SW 15FT (MISCELLANEOUS) ×2 IMPLANT
ELECT REM PT RETURN 9FT ADLT (ELECTROSURGICAL) ×2
ELECTRODE BLDE 4.0 EZ CLN MEGD (MISCELLANEOUS) ×1 IMPLANT
ELECTRODE REM PT RTRN 9FT ADLT (ELECTROSURGICAL) ×1 IMPLANT
ENDOSKELETON NANOLOCK 10 XL (Cage) ×2 IMPLANT
GLOVE BIOGEL PI IND STRL 8.5 (GLOVE) ×1 IMPLANT
GLOVE BIOGEL PI INDICATOR 8.5 (GLOVE) ×1
GLOVE SS BIOGEL STRL SZ 8.5 (GLOVE) ×1 IMPLANT
GLOVE SUPERSENSE BIOGEL SZ 8.5 (GLOVE) ×1
GOWN STRL REUS W/ TWL LRG LVL3 (GOWN DISPOSABLE) ×1 IMPLANT
GOWN STRL REUS W/TWL 2XL LVL3 (GOWN DISPOSABLE) ×4 IMPLANT
GOWN STRL REUS W/TWL LRG LVL3 (GOWN DISPOSABLE) ×1
KIT BASIN OR (CUSTOM PROCEDURE TRAY) ×2 IMPLANT
KIT POSITION SURG JACKSON T1 (MISCELLANEOUS) IMPLANT
KIT ROOM TURNOVER OR (KITS) ×2 IMPLANT
LIGHT SOURCE ANGLE TIP STR 7FT (MISCELLANEOUS) ×2 IMPLANT
MODULE NVM5 NEXT GEN EMG (NEEDLE) ×2 IMPLANT
NEEDLE 22X1 1/2 (OR ONLY) (NEEDLE) ×2 IMPLANT
NEEDLE I-PASS III (NEEDLE) ×2 IMPLANT
NEEDLE SPNL 18GX3.5 QUINCKE PK (NEEDLE) ×4 IMPLANT
NS IRRIG 1000ML POUR BTL (IV SOLUTION) ×2 IMPLANT
PACK LAMINECTOMY ORTHO (CUSTOM PROCEDURE TRAY) ×2 IMPLANT
PACK UNIVERSAL I (CUSTOM PROCEDURE TRAY) ×2 IMPLANT
PAD ARMBOARD 7.5X6 YLW CONV (MISCELLANEOUS) ×4 IMPLANT
PATTIES SURGICAL .5 X.5 (GAUZE/BANDAGES/DRESSINGS) IMPLANT
PATTIES SURGICAL .5 X1 (DISPOSABLE) ×4 IMPLANT
POSITIONER HEAD PRONE TRACH (MISCELLANEOUS) ×2 IMPLANT
PROBE BALL TIP NVM5 SNG USE (BALLOONS) ×2 IMPLANT
PUTTY DBX 2.5CC (Putty) ×2 IMPLANT
PUTTY DBX 2.5CC DEPUY (Putty) ×1 IMPLANT
REDUCTION EXT RELINE MAS MOD (Neuro Prosthesis/Implant) ×4 IMPLANT
ROD RELINE MAS TI 5.5X35MM LRD (Rod) ×4 IMPLANT
SCREW LOCK RELINE 5.5 TULIP (Screw) ×8 IMPLANT
SCREW RED MAS POLY 7.5X40MM (Screw) ×2 IMPLANT
SCREW RELINE RED 6.5X45MM POLY (Screw) ×2 IMPLANT
SCREW SHANK RELINE 6.5X45MM 2C (Screw) ×2 IMPLANT
SCREW SHANK RELINE 7.5X40MM 2C (Screw) ×2 IMPLANT
SHEET CONFORM 45LX20WX5H (Bone Implant) ×2 IMPLANT
SPONGE LAP 4X18 X RAY DECT (DISPOSABLE) ×4 IMPLANT
SPONGE SURGIFOAM ABS GEL 100 (HEMOSTASIS) ×2 IMPLANT
SURGIFLO W/THROMBIN 8M KIT (HEMOSTASIS) IMPLANT
SUT BONE WAX W31G (SUTURE) ×2 IMPLANT
SUT MNCRL AB 3-0 PS2 18 (SUTURE) ×4 IMPLANT
SUT VIC AB 1 CT1 18XCR BRD 8 (SUTURE) ×1 IMPLANT
SUT VIC AB 1 CT1 8-18 (SUTURE) ×1
SUT VIC AB 2-0 CT1 18 (SUTURE) ×2 IMPLANT
SYR BULB IRRIGATION 50ML (SYRINGE) ×2 IMPLANT
SYR CONTROL 10ML LL (SYRINGE) ×4 IMPLANT
TOWEL OR 17X24 6PK STRL BLUE (TOWEL DISPOSABLE) ×2 IMPLANT
TOWEL OR 17X26 10 PK STRL BLUE (TOWEL DISPOSABLE) ×2 IMPLANT
TRAY FOLEY W/METER SILVER 16FR (SET/KITS/TRAYS/PACK) ×2 IMPLANT
WATER STERILE IRR 1000ML POUR (IV SOLUTION) ×2 IMPLANT
YANKAUER SUCT BULB TIP NO VENT (SUCTIONS) ×2 IMPLANT

## 2016-08-07 NOTE — H&P (Signed)
History of Present Illness  The patient is a 41 year old female who comes in today for a preoperative History and Physical. The patient is scheduled for a TLIF L5-S1 to be performed by Dr. Debria Garretahari D. Shon BatonBrooks, [facility] on 08/07/16 . Please see the hospital record for complete dictated history and physical. Pt is type 2 DM. Pt is controlled on oral medication. She also has a hx of vertigo. The pt recently quit smoking in May 2018. The pts MRI is significant for more right sided disc disease. the pt does experience right leg symptoms but the left lower extremity is worse.   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 Chronic Obstructive Lung Disease  Mother. Diabetes Mellitus  Father. Hypertension  Father.  Social History  Children  1 Current work status  unemployed Exercise  Exercises daily; does other Living situation  live with spouse Marital status  married Never consumed alcohol  04/21/2016: Never consumed alcohol No history of drug/alcohol rehab  Number of flights of stairs before winded  4-5 Tobacco / smoke exposure  04/21/2016: no Tobacco use  Current every day smoker. 04/21/2016: smoke(d) 1 pack(s) per day Under pain contract   Medication History Percocet (10-325MG  Tablet, Oral) Active. Lisinopril (5MG  Tablet, Oral) Active. (qd) MetFORMIN HCl (1000MG  Tablet, Oral) Active. (bid) Promethazine HCl (12.5MG  Tablet, Oral) Active. TiZANidine HCl (4MG  Tablet, Oral) Active. (prn) Aspirin (81MG  Tablet DR, Oral) Active. (qd) Vitamin B-12 (1000MCG Tablet, Oral) Active. (2000 qd) Probiotic + Omega-3 (Oral) Active. (qd) Medications Reconciled  Vitals  08/04/2016  8:40 AM Weight: 177 lb Height: 62in Body Surface Area: 1.81 m Body Mass Index: 32.37 kg/m  Temp.: 99.71F  Pulse: 92 (Regular)  BP: 157/100 (Sitting, Left Arm, Standard)  General General Appearance-Not in acute distress. Orientation-Oriented X3. Build & Nutrition-Well nourished and Well developed.  Integumentary General Characteristics Surgical Scars - no surgical scar evidence of previous lumbar surgery. Lumbar Spine-Skin examination of the lumbar spine is without deformity, skin lesions, lacerations or abrasions.  Chest and Lung Exam Auscultation Breath sounds - Normal and Clear.  Cardiovascular Auscultation Rhythm - Regular rate and rhythm.  Abdomen Palpation/Percussion Palpation and Percussion of the abdomen reveal - Soft, Non Tender and No Rebound tenderness.  Peripheral Vascular Lower Extremity Palpation - Posterior tibial pulse - Bilateral - 2+. Dorsalis pedis pulse - Bilateral - 2+.  Neurologic Sensation Lower Extremity - Left - sensation is diminished in the lower extremity. Right - sensation is intact in the lower extremity. Reflexes Patellar Reflex - Bilateral - 2+. Achilles Reflex - Bilateral - 2+. Testing Seated Straight Leg Raise - Left - Seated straight leg raise positive. Right - Seated straight leg raise negative.  Musculoskeletal Spine/Ribs/Pelvis  Lumbosacral Spine: Inspection and Palpation - Tenderness - left lumbar paraspinals tender to palpation and right lumbar paraspinals tender to palpation. Strength and Tone: Strength - Hip Flexion - Bilateral - 5/5. Knee Extension - Bilateral - 5/5. Knee Flexion - Bilateral - 5/5. Ankle Dorsiflexion - Bilateral - 5/5. Ankle Plantarflexion - Bilateral - 5/5. Heel walk - Bilateral - able to heel walk without difficulty. Toe Walk - Bilateral - able to walk on toes without difficulty. Heel-Toe Walk - Bilateral - able to heel-toe walk without difficulty. ROM - Flexion - moderately  decreased range  of motion and painful. Extension - moderately decreased range of motion and painful. Left Lateral Bending - moderately decreased range of motion and painful. Right Lateral Bending - moderately decreased range of motion and painful. Right Rotation - moderately decreased range of motion and painful. Left Rotation - moderately decreased range of motion and painful. Pain - neither flexion or extension is more painful than the other. Lumbosacral Spine - Waddell's Signs - no Waddell's signs present. Lower Extremity Range of Motion - No true hip, knee or ankle pain with range of motion. Gait and Station - [employer] - no assistive devices.    Assessment & Plan  Goal Of Surgery: Discussed that goal of surgery is to reduce pain and improve function and quality of life. Patient is aware that despite all appropriate treatment that there pain and function could be the same, worse, or different. Posterior decompression/Fusion:Risks of surgery include infection, bleeding, nerve damage, death, stroke, paralysis, failure to heal, need for further surgery, ongoing or worse pain, need for further surgery, CSF leak, loss of bowel or bladder, and recurrent disc herniation or stenosis which would necessitate need for further surgery. Non-union, hardware failure, adjacent segment disease and recurrent pain. Hardware breakage, mal-position requiring surgery to correct or remove.  According to her MRI that was done in January 2018, there is a prominent disk extrusion at L5-S1. The disk fragment extends primarily to the right. There is compression of the right S1 nerve root in the lateral recess, but there is sparing of the right L5 nerve root. There is prominent bilateral facet joint hypertrophy and facet joint effusion.  CT myelogram that was performed in April of 2018 is significant for abnormal L4-L5 disk with concordant reproduction of severe low back pain without radiation, also abnormal L5-S1 disk with concordant  reproduction of severe back pain and bilateral leg pain was noted.  In reviewing her history prior to seeing me, she did go to formalized physical therapy for I think between six to eight sessions. I did tell her that I need those results, so I can review them, but at this point despite injection therapy, pain medical management, and physical therapy, her quality of life continues to suffer. Initially her CT discogram demonstrated two-level pathology, but her insurance company would not allow [place] to move forward with a two-level fusion. They did, however, say that she could move forward with a single level. After much discussion, she would like to proceed with a single-level fusion. Her thought is some improvement in her pain is better than no improvement in her severe pain. I did tell her that there is still a risk that in the future we may need to do the fusion at the L4-5 level. There is a risk of no improvement or worsening symptoms along with infection, bleeding, nerve damage, death, stroke, paralysis, failure to heal, need for further surgery, ongoing or worse pain. She has expressed an understanding of the risks and the surgery. I am very pleased that she has not smoked in the last seven weeks. She does note irritability, but she is doing well with her smoking cessation. At this point, we will move forward with a single-level L5-S1 TLIF.

## 2016-08-07 NOTE — Brief Op Note (Signed)
08/07/2016  10:59 AM  PATIENT:  Debra Padilla  41 y.o. female  PRE-OPERATIVE DIAGNOSIS:  L5-S1 Degenerative disc disease with lumbar back pain  POST-OPERATIVE DIAGNOSIS:  L5-S1 Degenerative disc disease with lumbar back pain  PROCEDURE:  Procedure(s) with comments: TRANSFORAMINAL LUMBAR INTERBODY FUSION (TLIF) WITH PEDICLE SCREW FIXATION 1 LEVEL (N/A) - 4 hrs  SURGEON:  Surgeon(s) and Role:    Venita Lick* Zaydee Aina, MD - Primary  PHYSICIAN ASSISTANT:   ASSISTANTS: none   ANESTHESIA:   general  EBL:  Total I/O In: 1959.4 [I.V.:1709.4; IV Piggyback:250] Out: 580 [Urine:330; Blood:250]  BLOOD ADMINISTERED:none  DRAINS: none   LOCAL MEDICATIONS USED:  MARCAINE     SPECIMEN:  No Specimen  DISPOSITION OF SPECIMEN:  N/A  COUNTS:  YES  TOURNIQUET:  * No tourniquets in log *  DICTATION: .Dragon Dictation  PLAN OF CARE: Admit to inpatient   PATIENT DISPOSITION:  PACU - hemodynamically stable.

## 2016-08-07 NOTE — Evaluation (Addendum)
Physical Therapy Evaluation and discharge Patient Details Name: Debra MoraleMelissa Koelling MRN: [id number] DOB: 11/08/1975 Today's Date: 08/07/2016   History of Present Illness  Pt is a 41 y/o female s/p L5-S1 TLIF. PMH includes HTN, DM, and blood dyscrasia.   Clinical Impression  Patient is s/p above surgery resulting in the deficits listed below (see PT Problem List). PTA, pt was independent with functional mobility. Upon evaluation, pt mod I to min guard for all mobility. All education completed. Pt reports she feels comfortable with mobility, and does not need any further PT. Pt with no further skilled PT needs. Will sign off. Please reconsult if needs change.      Follow Up Recommendations No PT follow up;Supervision for mobility/OOB    Equipment Recommendations  None recommended by PT    Recommendations for Other Services       Precautions / Restrictions Precautions Precautions: Back Precaution Booklet Issued: Yes (comment) Precaution Comments: Reviewed back precautions with pt. Pt maintained throughout session.  Required Braces or Orthoses: Spinal Brace Spinal Brace: Lumbar corset Restrictions Weight Bearing Restrictions: No      Mobility  Bed Mobility Overal bed mobility: Modified Independent             General bed mobility comments: No assist required. Demonstrated good log roll technique without cues.   Transfers Overall transfer level: Needs assistance Equipment used: Rolling walker (2 wheeled) Transfers: Sit to/from Stand Sit to Stand: Supervision         General transfer comment: Supervision for safety. Verbal cues for safe hand placement.   Ambulation/Gait Ambulation/Gait assistance: Supervision Ambulation Distance (Feet): 250 Feet Assistive device: Rolling walker (2 wheeled) Gait Pattern/deviations: Step-through pattern;Decreased stride length Gait velocity: Decreased Gait velocity interpretation: Below normal speed for age/gender General Gait Details:  Slow, guarded gait with RW. OVerall steady and no LOB noted. Verbal cues for upright posture and proximity to device.   Stairs Stairs: Yes Stairs assistance: Min guard Stair Management: One rail Right;Step to pattern;Forwards (HHA) Number of Stairs: 1 General stair comments: Reviewed stair navigation with threshold step using RW. Practiced one step with pt with BUE support to simulate home environment. Min guard required for safety. Educated husband about assist to be provided at home.   Wheelchair Mobility    Modified Rankin (Stroke Patients Only)       Balance Overall balance assessment: Needs assistance Sitting-balance support: No upper extremity supported;Feet supported Sitting balance-Leahy Scale: Good     Standing balance support: Bilateral upper extremity supported;During functional activity;No upper extremity supported Standing balance-Leahy Scale: Fair Standing balance comment: Able to maintain static without support                              Pertinent Vitals/Pain Pain Assessment: 0-10 Pain Score: 10-Worst pain ever Pain Location: back  Pain Descriptors / Indicators: Aching;Operative site guarding;Sore Pain Intervention(s): Limited activity within patient's tolerance;Monitored during session;Repositioned    Home Living Family/patient expects to be discharged to:: Private residence Living Arrangements: Spouse/significant other Available Help at Discharge: Family;Available 24 hours/day Type of Home: House Home Access: Stairs to enter Entrance Stairs-Rails: None Entrance Stairs-Number of Steps: 1 (threshold ) Home Layout: One level Home Equipment: Walker - 2 wheels;Walker - 4 wheels;Bedside commode      Prior Function Level of Independence: Independent               Hand Dominance        Extremity/Trunk Assessment  Upper Extremity Assessment Upper Extremity Assessment: Defer to OT evaluation    Lower Extremity Assessment Lower  Extremity Assessment: RLE deficits/detail;LLE deficits/detail RLE Deficits / Details: tightness reported in thighs and hips.  LLE Deficits / Details: tightness reported in thighs and hips.     Cervical / Trunk Assessment Cervical / Trunk Assessment: Other exceptions Cervical / Trunk Exceptions: s/p TLIF  Communication   Communication: No difficulties  Cognition Arousal/Alertness: Awake/alert Behavior During Therapy: WFL for tasks assessed/performed Overall Cognitive Status: Within Functional Limits for tasks assessed                                        General Comments General comments (skin integrity, edema, etc.): Pt reported at end of session, she was comfortable with mobility and did not need any further PT services. Eduacted about how to perform car transfer and about generalized walking program at home. All education completed.     Exercises     Assessment/Plan    PT Assessment Patent does not need any further PT services  PT Problem List         PT Treatment Interventions      PT Goals (Current goals can be found in the Care Plan section)  Acute Rehab PT Goals Patient Stated Goal: to go home  PT Goal Formulation: With patient Time For Goal Achievement: 08/07/16 Potential to Achieve Goals: Good    Frequency     Barriers to discharge        Co-evaluation               AM-PAC PT "6 Clicks" Daily Activity  Outcome Measure Difficulty turning over in bed (including adjusting bedclothes, sheets and blankets)?: None Difficulty moving from lying on back to sitting on the side of the bed? : None Difficulty sitting down on and standing up from a chair with arms (e.g., wheelchair, bedside commode, etc,.)?: None Help needed moving to and from a bed to chair (including a wheelchair)?: None Help needed walking in hospital room?: None Help needed climbing 3-5 steps with a railing? : A Little 6 Click Score: 23    End of Session Equipment  Utilized During Treatment: Gait belt;Back brace Activity Tolerance: Patient tolerated treatment well Patient left: in bed;with call bell/phone within reach;with family/visitor present Nurse Communication: Mobility status PT Visit Diagnosis: Other abnormalities of gait and mobility (R26.89);Pain Pain - part of body:  (back )    Time: 1721-1747 PT Time Calculation (min) (ACUTE ONLY): 26 min   Charges:   PT Evaluation $PT Eval Low Complexity: 1 Procedure PT Treatments $Gait Training: 8-22 mins   PT G Codes:        Gladys Damme, PT, DPT  Acute Rehabilitation Services  Pager: [phone]   Lehman Prom 08/07/2016, 6:57 PM

## 2016-08-07 NOTE — Anesthesia Procedure Notes (Signed)
Procedure Name: Intubation Date/Time: 08/07/2016 7:32 AM Performed by: Neldon Newport Pre-anesthesia Checklist: Timeout performed, Patient being monitored, Suction available, Emergency Drugs available and Patient identified Patient Re-evaluated:Patient Re-evaluated prior to induction Oxygen Delivery Method: Circle system utilized Preoxygenation: Pre-oxygenation with 100% oxygen Induction Type: IV induction Ventilation: Mask ventilation without difficulty and Oral airway inserted - appropriate to patient size Laryngoscope Size: Mac and 3 Grade View: Grade I Tube type: Oral Tube size: 7.0 mm Number of attempts: 1 Placement Confirmation: breath sounds checked- equal and bilateral,  positive ETCO2 and ETT inserted through vocal cords under direct vision Secured at: 22 cm Tube secured with: Tape Dental Injury: Teeth and Oropharynx as per pre-operative assessment

## 2016-08-07 NOTE — Op Note (Signed)
Operative note.  Preoperative diagnosis. 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  Postoperative diagnosis. Same.  Operative procedure. Transforaminal lumbar interbody fusion L5-S1.  Implant system used. Invasive MIS pedicle screw system. L5 45 mm length 6.5 diameter screws. S1. 7.5 mm diameter 40 mm length screws. Titan nano lock intervertebral cage. Size 10 extra long. Packed with autograft, DBX. Also utilized conform allograft sheet.  Complications. None.  Intraoperative neuro monitoring throughout the entire case was performed. All screws tested greater than 35 mA. No adverse free running EMGs.  Indications. 41 year old woman with long-standing the back buttock and bilateral leg pain. Patient has tried and failed multiple forms of conservative management. As a result we elected to proceed with surgery. All appropriate risks benefits and alternatives were discussed with the patient and consent was obtained.  Operative note.  Patient was brought the operating room placed supine the operating table. After successful induction of general anesthesia and endotracheal intubation teds SCDs and a Foley were inserted. The neuro monitoring pads were then applied and the patient was then turned prone onto the Wilson frame. All appropriate bony prominences were well-padded. The back was then prepped and draped in standard fashion. Timeout was taken to confirm patient procedure and all other important data.  The left L5 5 and S1 pedicles were identified using fluoroscopy and their location marked out on the skin this area was infiltrated with quarter percent Marcaine with epinephrine. Small incision was made in the Jamshidi needle was advanced down to the lateral aspect of the L5 pedicle. I confirm position in the AP and lateral planes with fluoroscopy. I then advanced the Jamshidi needle under direct fluoroscopy guidance. I also was  stimulating the Jamshidi needle. Once I was nearing the medial wall of the pedicle on the AP view I switched to the lateral. I confirm that I was just beyond the posterior wall vertebral body. At this point I advanced the Jamshidi needle into the vertebral body. I then placed the guide pin and removed the Jamshidi needle. I repeated this exact same procedure at S1. Once both pedicles were cannulated I measured and then inserted the pedicle screw. At L5 using 6.5 diameter 45 mm length screw. At S1 it was a 40 mm length screw 7.5 diameter. Both screws had excellent purchase. I then directly stimulated each screw and there was no adverse reaction at 40 mA.  With the left side complete I then went to the right. I again  infiltrated with quarter percent Marcaine with epinephrine I made a small incision and sharply dissected down to the deep tissue. Deep tissues incised and I bluntly dissected through the paraspinal muscles until I could palpate the facet capsule. Once this was done I then placed my Jamshidi needle at the lateral side of the L5 pedicle and using the same technique edges of the contralateral side advanced the Jamshidi needle under direct fluoroscopy guidance as well as of the neuro monitoring. I then cannulated the pedicle and then repeated this at S1. I then placed the same size screws at this level. The screws were attached to the retracting devices. I set my posterior lateral tractor and had excellent visualization the posterior lateral aspect of the spine. I then directly stimulated both pedicles screws again there was no adverse response at 35 mA of stimulation.  With the posterior lateral aspect of the right side spine identified and used an osteotome to resect the inferior L5 facet in its entirety.  I then used a 3 mm Kerrison rongeur to perform a generous laminotomy of L5. Using my Penfield 4 I dissected through the ligamentum flavum and then removed the ligamentum flavum to expose the lateral  aspect of the thecal sac and the S1 nerve root. I then resected the pars with my Kerrison rongeur and now I could visualize the L5 nerve root. Excellent visualization of the S1 and L5 nerve roots. I could also palpate the medial borders of both pedicles and there was no evidence of breech. With the posterior lateral aspect of the disc space now identified placed patties to protect both the L5 and S1 nerve root and its medial retractor to the thecal sac. An annulotomy was then performed with the 15 blade scalpel and then I used curettes and pituitary rongeurs to remove all of the disc material. The endplates were scraped with the side scraping curettes. I then was able to palpate with my [facility] elevator along the endplates confirm and removed all the disc material and irrigated copiously with normal saline. I then used trial devices and elected to use a size 10 extra long implant. The conformer sheet was then placed along the posterior aspect of the annulus and the implant was packed with local bone from the decompression and DBX. I malleted this into the disc space and then pushed across midline until it came to rest in a horizontal position in the anterior third of the disc space. X-rays demonstrated satisfactory position of the intervertebral cage. At this point I irrigated again and then made sure there was no compression of the thecal sac or nerve roots by the cage or any of the the bone graft material. I then applied polyaxial heads to the screws and disassembled the retracting device. I then kyphosis out of the Wilson frame and then inserted a 35 mm length rod this was the locking caps were applied and torqued according manufacturer's standards I then repeated this on the contralateral side.  At this point final x-rays showed satisfactory hardware position. Direct visualization of the decompression level site confirmed adequate decompression. At this point both wounds were copiously irrigated with normal  saline. I then closed the deep fascia both wounds with interrupted #1 Vicryl sutures in a layered 2-0 Vicryl sutures and then 3-0 Monocryl. Steri-Strips dry dressings were applied and the patient and the patient was transferred to the PACU without incident.

## 2016-08-07 NOTE — Transfer of Care (Signed)
Immediate Anesthesia Transfer of Care Note  Patient: Zarrah Erhart  Procedure(s) Performed: Procedure(s) with comments: TRANSFORAMINAL LUMBAR INTERBODY FUSION (TLIF) WITH PEDICLE SCREW FIXATION 1 LEVEL (N/A) - 4 hrs  Patient Location: PACU  Anesthesia Type:General  Level of Consciousness: awake, alert  and oriented  Airway & Oxygen Therapy: Patient Spontanous Breathing and Patient connected to face mask oxygen  Post-op Assessment: Report given to RN, Post -op Vital signs reviewed and stable and Patient moving all extremities X 4  Post vital signs: Reviewed and stable  Last Vitals:  Vitals:   08/07/16 0558 08/07/16 1050  BP: (!) 158/86 128/72  Pulse: 71 89  Resp: 20 18  Temp: 36.7 C 37.1 C    Last Pain:  Vitals:   08/07/16 1050  TempSrc:   PainSc: (P) Asleep      Patients Stated Pain Goal: 6 (08/07/16 0552)  Complications: No apparent anesthesia complications

## 2016-08-07 NOTE — Progress Notes (Signed)
Pharmacy Antibiotic Note  Debra MoraleMelissa Padilla is a 41 y.o. female admitted on 08/07/2016 with Chronic midline low back pain with left-sided sciatica, scheduled for lumbar fusion.  Pharmacy has been consulted for Vancomycin dosing for surgical prophylaxis s/p lumbar fusion surgery on 08/07/16.  Pt is allergic to PCN.  No drain placed. Received vancomycin 1250 mg IV x1 preop 7/19 @ 06:59  Plan: Give Vancomycin 1000 mg IV x1 dose approximately 12 hours post op. Pharmacy will sign off.   Height: 5\' 2"  (157.5 cm) Weight: 177 lb (80.3 kg) IBW/kg (Calculated) : 50.1  Temp (24hrs), Avg:98.3 F (36.8 C), Min:98 F (36.7 C), Max:98.7 F (37.1 C)   Recent Labs Lab 08/04/16 1335  WBC 12.6*  CREATININE 0.80    Estimated Creatinine Clearance: 92.7 mL/min (by C-G formula based on SCr of 0.8 mg/dL).    Allergies  Allergen Reactions  . Amoxicillin Hives and Itching    Has patient had a PCN reaction causing immediate rash, facial/tongue/throat swelling, SOB or lightheadedness with hypotension: No Has patient had a PCN reaction causing severe rash involving mucus membranes or skin necrosis: No Has patient had a PCN reaction that required hospitalization No Has patient had a PCN reaction occurring within the last 10 years: No If all of the above answers are "NO", then may proceed with Cephalosporin use.   Marland Kitchen. Morphine Hives and Itching  . Sulfamethoxazole-Trimethoprim Hives, Itching and Other (See Comments)    Gets a funny taste in mouth   Thank you for allowing pharmacy to be a part of this patient's care. Noah Delaineuth Lavenia Stumpo, RPh Clinical Pharmacist Pager: [phone] 08/07/2016 1:49 PM

## 2016-08-08 ENCOUNTER — Encounter (HOSPITAL_COMMUNITY): Payer: Self-pay | Admitting: Orthopedic Surgery

## 2016-08-08 ENCOUNTER — Inpatient Hospital Stay (HOSPITAL_COMMUNITY): Payer: 59

## 2016-08-08 LAB — GLUCOSE, CAPILLARY: GLUCOSE-CAPILLARY: 150 mg/dL — AB (ref 65–99)

## 2016-08-08 LAB — HEMOGLOBIN A1C
Hgb A1c MFr Bld: 6.8 % — ABNORMAL HIGH (ref 4.8–5.6)
MEAN PLASMA GLUCOSE: 148 mg/dL

## 2016-08-08 MED ORDER — OXYCODONE-ACETAMINOPHEN 10-325 MG PO TABS: 1.0000 | ORAL_TABLET | ORAL | 0 refills | Status: AC | PRN

## 2016-08-08 MED ORDER — ONDANSETRON HCL 4 MG PO TABS: 4.0000 mg | ORAL_TABLET | Freq: Three times a day (TID) | ORAL | 0 refills | Status: AC | PRN

## 2016-08-08 NOTE — Progress Notes (Signed)
    Subjective: Procedure(s) (LRB): TRANSFORAMINAL LUMBAR INTERBODY FUSION (TLIF) WITH PEDICLE SCREW FIXATION 1 LEVEL (N/A) 1 Day Post-Op  Patient reports pain as 4 on 0-10 scale.  Reports decreased leg pain reports incisional back pain   Positive void Negative bowel movement Positive flatus Negative chest pain or shortness of breath  Objective: Vital signs in last 24 hours: Temp:  [97.9 F (36.6 C)-98.7 F (37.1 C)] 98.3 F (36.8 C) (07/20 0325) Pulse Rate:  [76-97] 91 (07/20 0325) Resp:  [11-20] 18 (07/20 0325) BP: (110-141)/(62-88) 110/62 (07/20 0325) SpO2:  [93 %-100 %] 97 % (07/20 0325)  Intake/Output from previous day: 07/19 0701 - 07/20 0700 In: 3719.4 [P.O.:1160; I.V.:1709.4; IV Piggyback:250] Out: 2030 [Urine:1780; Blood:250]  Labs: No results for input(s): WBC, RBC, HCT, PLT in the last 72 hours. No results for input(s): NA, K, CL, CO2, BUN, CREATININE, GLUCOSE, CALCIUM in the last 72 hours. No results for input(s): LABPT, INR in the last 72 hours.  Physical Exam: Neurologically intact ABD soft Intact pulses distally Incision: dressing C/D/I Compartment soft  Assessment/Plan: Patient stable  xrays satisfactory position  Continue mobilization with physical therapy Continue care  Advance diet Up with therapy  Pain controlled with meds.  D/C robaxin and start zanaflex D/C today if cleared by PT  Venita Lickahari Alfreida Steffenhagen, MD [facility] [phone]

## 2016-08-08 NOTE — Evaluation (Addendum)
Occupational Therapy Evaluation and Discharge Patient Details Name: Debra Padilla MRN: [id number] DOB: 1975/05/24 Today's Date: 08/08/2016    History of Present Illness Pt is a 41 y/o female s/p L5-S1 TLIF. PMH includes HTN, DM, and blood dyscrasia.    Clinical Impression   This 41 yo female admitted and underwent above presents to acute OT with all education completed. He mom just had back surgery and she helped her recover from hers so she is very aware of how she needs to do things. No further OT needs, we will D/C.     Follow Up Recommendations  No OT follow up;Supervision - Intermittent    Equipment Recommendations  None recommended by OT       Precautions / Restrictions Precautions Precautions: Back Precaution Comments: Reviewed back precautions with pt. Pt maintained throughout session.  Required Braces or Orthoses: Spinal Brace Spinal Brace: Lumbar corset;Applied in sitting position Restrictions Weight Bearing Restrictions: No      Mobility Bed Mobility               General bed mobility comments: Pt up in recliner upon my arrival  Transfers Overall transfer level: Needs assistance Equipment used: Rolling walker (2 wheeled) Transfers: Sit to/from Stand Sit to Stand: Supervision         General transfer comment: Supervision for safety. Verbal cues for safe hand placement.     Balance Overall balance assessment: Needs assistance Sitting-balance support: No upper extremity supported;Feet supported Sitting balance-Leahy Scale: Good     Standing balance support: Bilateral upper extremity supported;During functional activity Standing balance-Leahy Scale: Poor Standing balance comment: Able to maintain static without support; but needs RW for dynamic                           ADL either performed or assessed with clinical judgement   ADL Overall ADL's : Needs assistance/impaired Eating/Feeding: Independent;Sitting   Grooming: Set  up;Sitting   Upper Body Bathing: Set up;Sitting   Lower Body Bathing: Supervison/ safety;Set up;Sit to/from stand Lower Body Bathing Details (indicate cue type and reason): Pt can cross legs to get to feet Upper Body Dressing : Set up;Sitting   Lower Body Dressing: Supervision/safety;Set up;Sit to/from stand Lower Body Dressing Details (indicate cue type and reason): Pt can cross legs to get to feet Toilet Transfer: Ambulation;RW;BSC;Supervision/safety   Toileting- Clothing Manipulation and Hygiene: Supervision/safety;Sit to/from Nurse, children's Details (indicate cue type and reason): Pt has tub bench at home   General ADL Comments: Educated pt and husband on use of wet wipes for back peri care to help avoid twisting, using 2 cups for brushing teeth to avoid bending over sink     Vision Patient Visual Report: No change from baseline              Pertinent Vitals/Pain Pain Assessment: 0-10 Pain Score: 6  Pain Location: back  Pain Descriptors / Indicators: Operative site guarding;Sore Pain Intervention(s): Monitored during session;Premedicated before session;Repositioned     Hand Dominance Right   Extremity/Trunk Assessment Upper Extremity Assessment Upper Extremity Assessment: Overall WFL for tasks assessed           ommunication Communication Communication: No difficulties   Cognition Arousal/Alertness: Awake/alert Behavior During Therapy: WFL for tasks assessed/performed Overall Cognitive Status: Within Functional Limits for tasks assessed  Home Living Family/patient expects to be discharged to:: Private residence Living Arrangements: Spouse/significant other Available Help at Discharge: Family;Available 24 hours/day Type of Home: House Home Access: Stairs to enter Enter[employer] CorporationEntrance Stairs-Number of Steps: 1 (threshold) Entrance Stairs-Rails: None Home Layout: One level     Bathroom  Shower/Tub: Tub/shower unit;Curtain   FirefighterBathroom Toilet: Standard     Home Equipment: Environmental consultantWalker - 2 wheels;Walker - 4 wheels;Bedside commode;Tub bench          Prior Functioning/Environment Level of Independence: Independent                 OT Problem List: Impaired balance (sitting and/or standing);Pain         OT Goals(Current goals can be found in the care plan section) Acute Rehab OT Goals Patient Stated Goal: to go home today   OT Frequency:                AM-PAC PT "6 Clicks" Daily Activity     Outcome Measure Help from another person eating meals?: None Help from another person taking care of personal grooming?: None Help from another person toileting, which includes using toliet, bedpan, or urinal?: None Help from another person bathing (including washing, rinsing, drying)?: None Help from another person to put on and taking off regular upper body clothing?: None Help from another person to put on and taking off regular lower body clothing?: None 6 Click Score: 24   End of Session Equipment Utilized During Treatment: Rolling walker;Back brace Nurse Communication:  (No further OT needs)  Activity Tolerance: Patient tolerated treatment well Patient left: in chair;with call bell/phone within reach;with family/visitor present  OT Visit Diagnosis: Pain;Unsteadiness on feet (R26.81) Pain - part of body:  (back)                Time: [id number] OT Time Calculation (min): 21 min Ignacia PalmaCathy Isobella Ascher, [place] [phone] 08/08/2016

## 2016-08-08 NOTE — Progress Notes (Signed)
Patient alert and oriented, mae's well, voiding adequate amount of urine, swallowing without difficulty, c/o discomfort and patient made aware of pain medication given already.  Patient discharged home with family. Script and discharged instructions given to patient. Patient and family stated understanding of d/c instructions given and has an appointment with MD.

## 2016-08-11 NOTE — Anesthesia Postprocedure Evaluation (Signed)
Anesthesia Post Note  Patient: Debra Padilla  Procedure(s) Performed: Procedure(s) (LRB): TRANSFORAMINAL LUMBAR INTERBODY FUSION (TLIF) WITH PEDICLE SCREW FIXATION 1 LEVEL (N/A)     Patient location during evaluation: PACU Anesthesia Type: General Level of consciousness: sedated Pain management: satisfactory to patient Vital Signs Assessment: post-procedure vital signs reviewed and stable Respiratory status: spontaneous breathing Cardiovascular status: stable Postop Assessment: no signs of nausea or vomiting Anesthetic complications: no    Last Vitals:  Vitals:   08/08/16 0325 08/08/16 0822  BP: 110/62 101/73  Pulse: 91 74  Resp: 18 16  Temp: 36.8 C 36.8 C    Last Pain:  Vitals:   08/08/16 0331  TempSrc:   PainSc: 8                  Debra Padilla EDWARD

## 2016-08-13 NOTE — Discharge Summary (Signed)
Physician Discharge Summary  Patient ID: Debra Padilla MRN: [id number] DOB/AGE: [birth year] 39 y.o.  Admit date: 08/07/2016 Discharge date: 08/08/2016  Admission Diagnoses:  Lumbar DDD  Discharge Diagnoses:  Active Problems:   Status post lumbar spinal fusion   Back pain   Past Medical History:  Diagnosis Date  . Blood dyscrasia    hemachromatosis  . Depression   . Diabetes mellitus without complication (HCC)    dx 2015  . Fibromyalgia   . Headache    hx  . Hypertension     Surgeries: Procedure(s): TRANSFORAMINAL LUMBAR INTERBODY FUSION (TLIF) WITH PEDICLE SCREW FIXATION 1 LEVEL on 08/07/2016   Consultants (if any):   Discharged Condition: Improved  Hospital Course: Debra Padilla is an 41 y.o. female who was admitted 08/07/2016 with a diagnosis of Lumbar DDD and went to the operating room on 08/07/2016 and underwent the above named procedures.  Post op day one pt was ambulating in hallway.  Pts pain was moderate controlled on oral medications.  Pt was urinating w/o difficulty.  Pt cleared by PT for DC.  She was given perioperative antibiotics:  Anti-infectives    Start     Dose/Rate Route Frequency Ordered Stop   08/07/16 2100  vancomycin (VANCOCIN) IVPB 1000 mg/200 mL premix     1,000 mg 200 mL/hr over 60 Minutes Intravenous  Once 08/07/16 1355 08/07/16 2110   08/07/16 0715  vancomycin (VANCOCIN) 1,250 mg in sodium chloride 0.9 % 250 mL IVPB     1,250 mg 166.7 mL/hr over 90 Minutes Intravenous To [facility] 08/06/16 0827 08/07/16 0829    .  She was given sequential compression devices, early ambulation, and TED for DVT prophylaxis.  She benefited maximally from the hospital stay and there were no complications.    Recent vital signs:  Vitals:   08/08/16 0325 08/08/16 0822  BP: 110/62 101/73  Pulse: 91 74  Resp: 18 16  Temp: 98.3 F (36.8 C) 98.2 F (36.8 C)    Recent laboratory studies:  Lab Results  Component Value Date   HGB 14.8 08/04/2016    HGB 15.0 06/03/2016   Lab Results  Component Value Date   WBC 12.6 (H) 08/04/2016   PLT 231 08/04/2016   No results found for: INR Lab Results  Component Value Date   NA 136 08/04/2016   K 4.3 08/04/2016   CL 101 08/04/2016   CO2 26 08/04/2016   BUN 6 08/04/2016   CREATININE 0.80 08/04/2016   GLUCOSE 189 (H) 08/04/2016    Discharge Medications:   Allergies as of 08/08/2016      Reactions   Amoxicillin Hives, Itching   Has patient had a PCN reaction causing immediate rash, facial/tongue/throat swelling, SOB or lightheadedness with hypotension: No Has patient had a PCN reaction causing severe rash involving mucus membranes or skin necrosis: No Has patient had a PCN reaction that required hospitalization No Has patient had a PCN reaction occurring within the last 10 years: No If all of the above answers are "NO", then may proceed with Cephalosporin use.   Morphine Hives, Itching   Sulfamethoxazole-trimethoprim Hives, Itching, Other (See Comments)   Gets a funny taste in mouth   Robaxin [methocarbamol] Other (See Comments)   HEADACHE      Medication List    STOP taking these medications   HYDROcodone-acetaminophen 10-325 MG tablet Commonly known as:  NORCO     TAKE these medications   acetaminophen 500 MG tablet Commonly known as:  TYLENOL Take  1,000 mg by mouth every 6 (six) hours as needed for mild pain.   aspirin EC 81 MG tablet Take 81 mg by mouth daily.   B-12 2000 MCG Tabs Take 2,000 mcg by mouth daily.   CULTURELLE Caps Take 1 tablet by mouth daily.   dimenhyDRINATE 50 MG tablet Commonly known as:  DRAMAMINE Take 50 mg by mouth every 8 (eight) hours as needed for dizziness.   Fish Oil 600 MG Caps Take 600 mg by mouth daily.   lisinopril 5 MG tablet Commonly known as:  PRINIVIL,ZESTRIL take 1 tablet by mouth once daily   loperamide 2 MG capsule Commonly known as:  IMODIUM Take 2 mg by mouth daily as needed for diarrhea or loose stools.    loratadine 10 MG tablet Commonly known as:  CLARITIN Take 10 mg by mouth daily as needed for allergies.   metFORMIN 1000 MG tablet Commonly known as:  GLUCOPHAGE Take 1,000 mg by mouth 2 (two) times daily with a meal.   ondansetron 4 MG tablet Commonly known as:  ZOFRAN Take 1 tablet (4 mg total) by mouth every 8 (eight) hours as needed for nausea or vomiting.   oxyCODONE-acetaminophen 10-325 MG tablet Commonly known as:  PERCOCET Take 1 tablet by mouth every 4 (four) hours as needed for pain.   promethazine 12.5 MG tablet Commonly known as:  PHENERGAN TAKE 1  TABLET BY MOUTH AT BEDTIME   tiZANidine 4 MG tablet Commonly known as:  ZANAFLEX TAKE 1 TABLET BY MOUTH AT BEDTIME       Diagnostic Studies: Dg Lumbar Spine 2-3 Views  Result Date: 08/08/2016 CLINICAL DATA:  Low back pain.  One day postop. EXAM: LUMBAR SPINE - 2-3 VIEW COMPARISON:  Multiple priors. FINDINGS: The patient is status post L5-S1 transforaminal lumbar interbody fusion. Interbody cage appears appropriately centered within the interspace. L5 and S1 pedicle screws are connected by rods. On AP radiograph there is asymmetric loss of interspace height at L4-5 on the RIGHT. On the lateral view, the L4-5 disc space is slightly narrowed and there is trace retrolisthesis. Vascular calcification. IMPRESSION: Status post L5-S1 TLIF.  No adverse features. Electronically Signed   By: Elsie StainJohn T Curnes M.D.   On: 08/08/2016 08:01   Dg Lumbar Spine 2-3 Views  Result Date: 08/07/2016 CLINICAL DATA:  Lumbar spine surgery. EXAM: LUMBAR SPINE - 2-3 VIEW COMPARISON:  CT 05/07/2016. FINDINGS: Lumbar vertebra numbered as per prior CT . L5-S1 posterior and interbody fusion. Hardware intact. Anatomic alignment. IMPRESSION: L5-S1 posterior and interbody fusion.  Anatomic alignment. Electronically Signed   By: Maisie Fushomas  Register   On: 08/07/2016 12:06   Dg C-arm 61-120 Min  Result Date: 08/07/2016 CLINICAL DATA:  Lumbar spine surgery. EXAM: DG  C-ARM 61-120 MIN COMPARISON:  CT 05/07/2016. FINDINGS: Lumbar spine numbered as per prior CT. L5-S1 posterior and interbody fusion. Hardware intact. Anatomic alignment. IMPRESSION: L5-S1 posterior interbody fusion.  Anatomic alignment . Electronically Signed   By: Maisie Fushomas  Register   On: 08/07/2016 12:05    Disposition: 01-Home or Self Care Pt will present to clinic in 2 weeks Post op medications provided  Discharge Instructions    Incentive spirometry RT    Complete by:  As directed       Follow-up Information    Venita LickBrooks, Dahari, MD. Schedule an appointment as soon as possible for a visit in 2 week(s).   Specialty:  Orthopedic Surgery Contact information: [address] [place] [id number] [phone]  Signed: Kirt BoysMayo, Rockwell Zentz Christina 08/13/2016, 11:21 AM

## 2016-08-19 ENCOUNTER — Encounter (HOSPITAL_COMMUNITY): Payer: Self-pay | Admitting: Orthopedic Surgery

## 2016-08-25 ENCOUNTER — Encounter (HOSPITAL_COMMUNITY): Payer: Self-pay | Admitting: Orthopedic Surgery

## 2018-02-03 ENCOUNTER — Other Ambulatory Visit: Payer: Self-pay | Admitting: Orthopedic Surgery

## 2018-02-03 DIAGNOSIS — M545 Low back pain, unspecified: Secondary | ICD-10-CM

## 2018-02-03 DIAGNOSIS — G8929 Other chronic pain: Secondary | ICD-10-CM

## 2018-02-11 ENCOUNTER — Inpatient Hospital Stay: Admission: RE | Admit: 2018-02-11 | Payer: 59 | Source: Ambulatory Visit

## 2018-02-16 NOTE — Discharge Instructions (Signed)

## 2018-02-17 ENCOUNTER — Inpatient Hospital Stay
Admission: RE | Admit: 2018-02-17 | Discharge: 2018-02-17 | Disposition: A | Discharge status: Home / Self Care | Payer: 59 | Source: Ambulatory Visit | Attending: Orthopedic Surgery | Admitting: Orthopedic Surgery

## 2018-03-03 ENCOUNTER — Other Ambulatory Visit: Payer: 59

## 2018-03-11 IMAGING — CR DG LUMBAR SPINE 2-3V
3 series · 3 of 3 positions shown · non-contrast
Comparison: Multiple priors.

CLINICAL DATA: Low back pain.  One day postop.

EXAM:
LUMBAR SPINE - 2-3 VIEW

[l-spine ap]
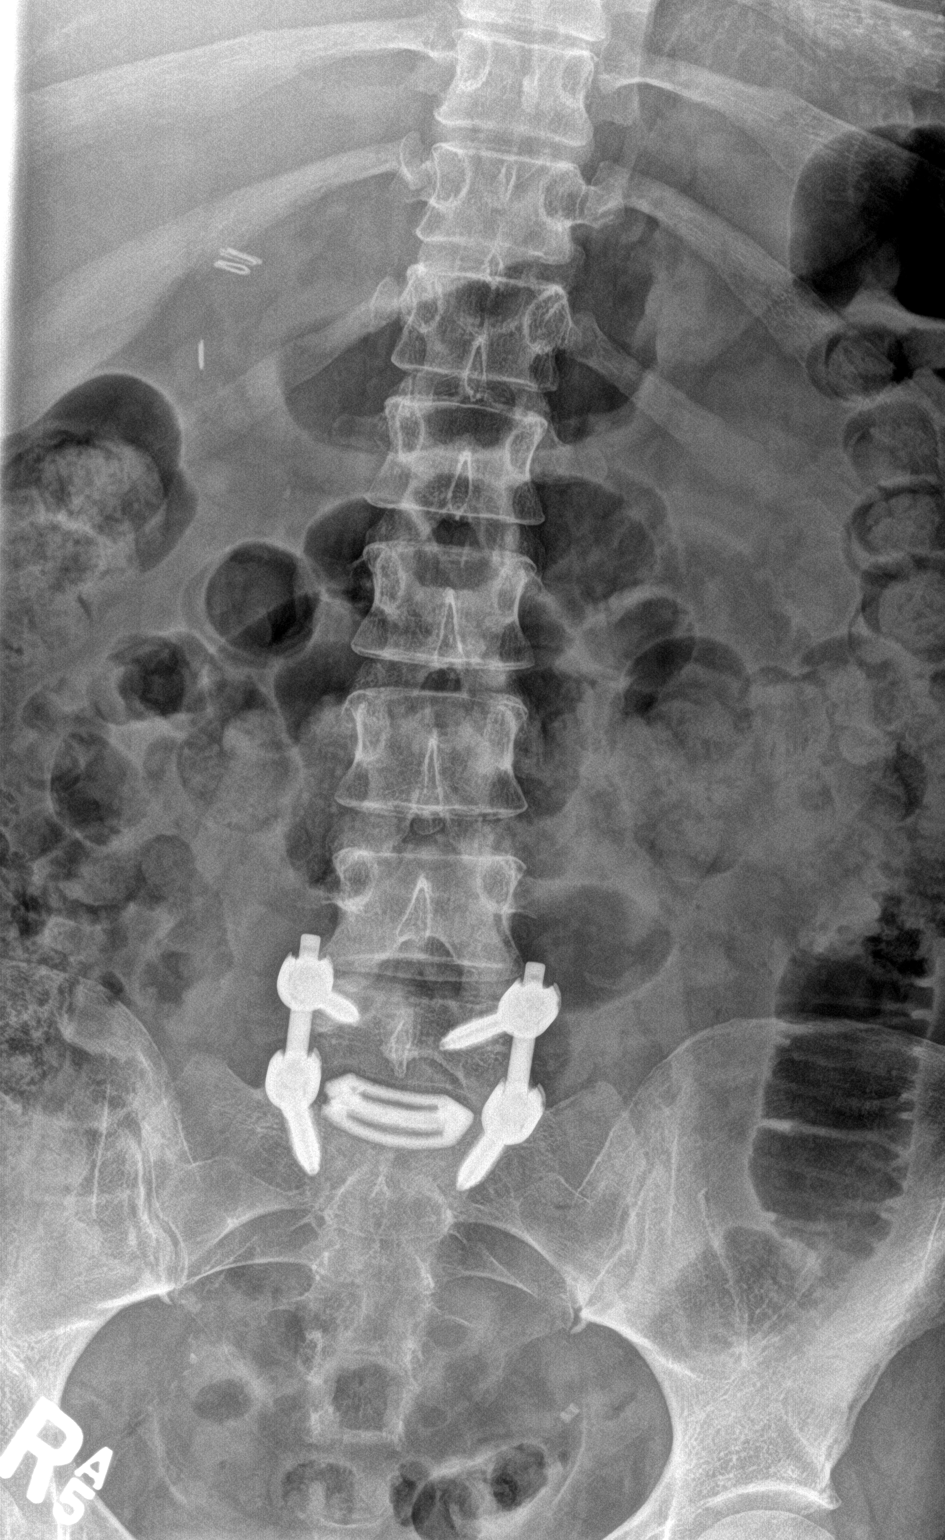

[l-spine lat]
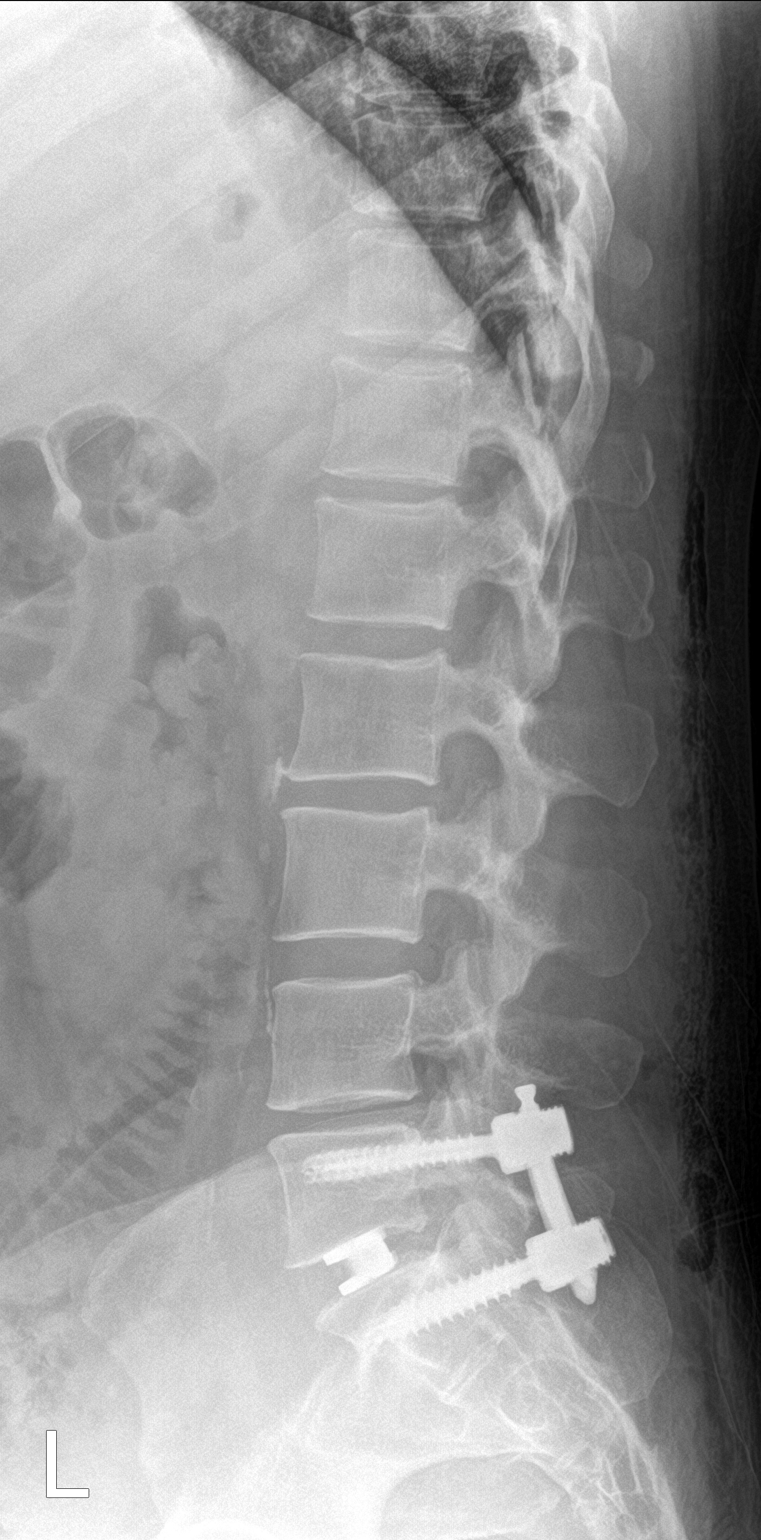

[l-spine spot]
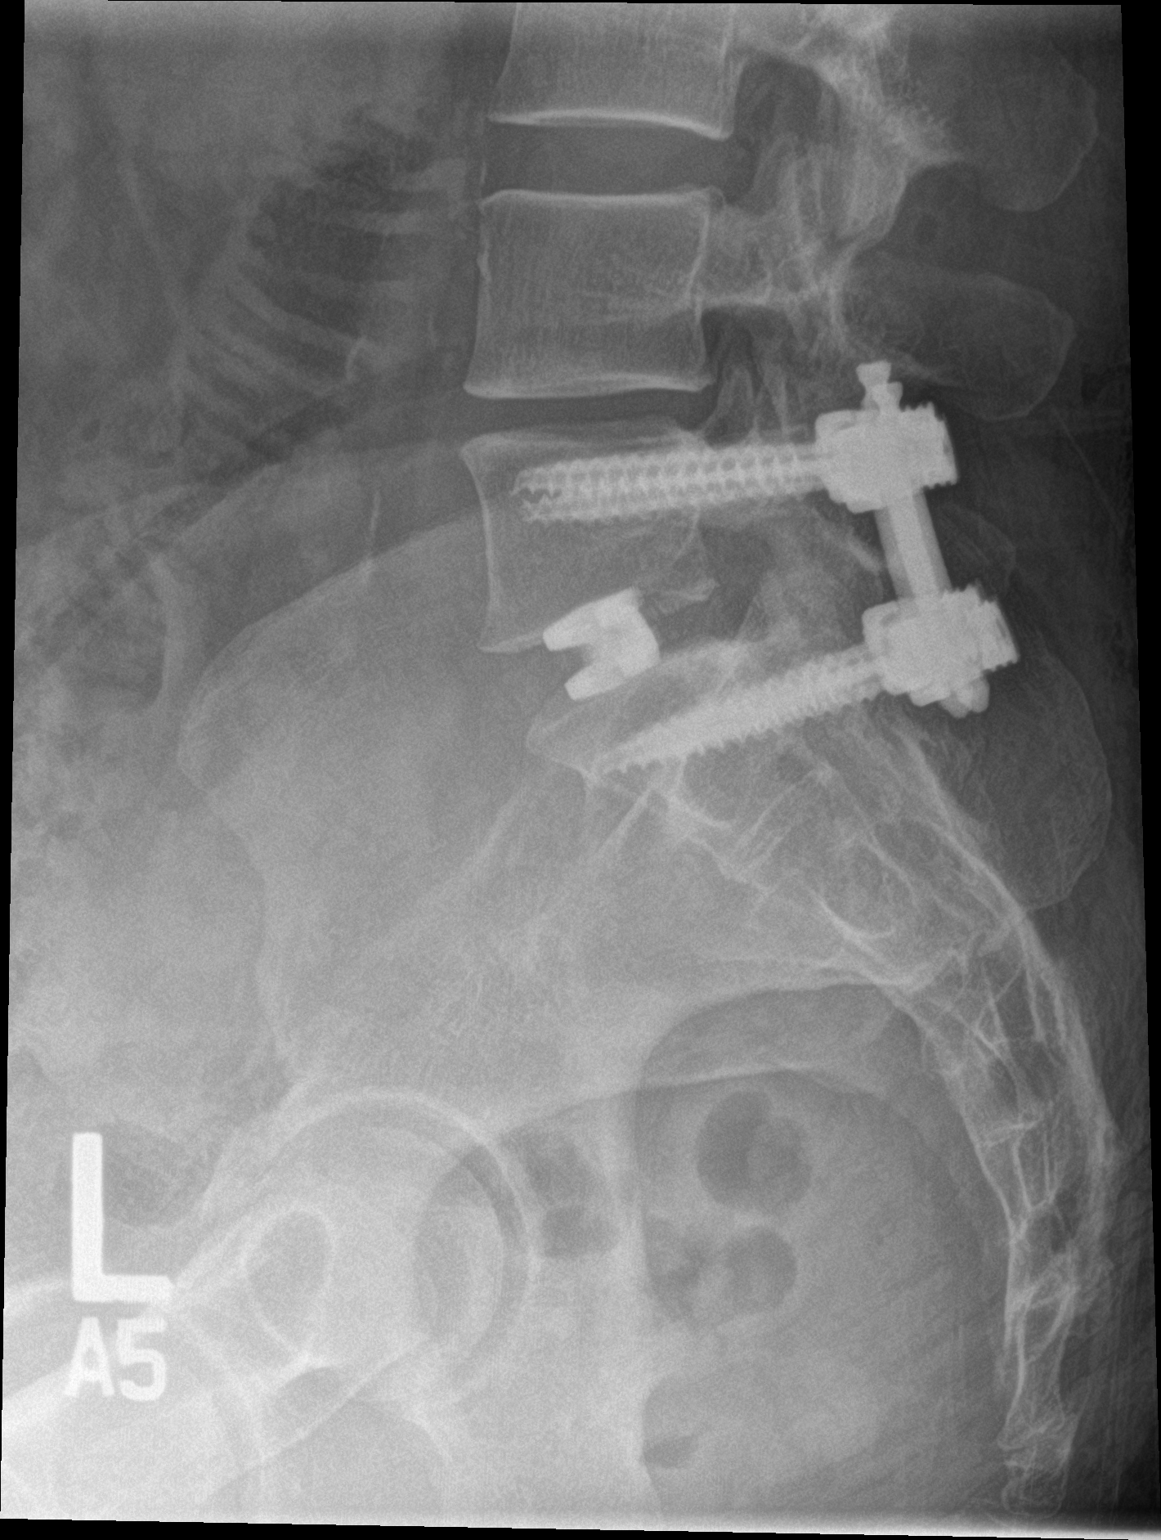

[3 of 3 positions shown; findings below may reference images not displayed]

FINDINGS: The patient is status post L5-S1 transforaminal lumbar interbody
fusion. Interbody cage appears appropriately centered within the
interspace. L5 and S1 pedicle screws are connected by rods.

On AP radiograph there is asymmetric loss of interspace height at
L4-5 on the RIGHT. On the lateral view, the L4-5 disc space is
slightly narrowed and there is trace retrolisthesis.

Vascular calcification.
IMPRESSION: Status post L5-S1 TLIF.  No adverse features.
